# Patient Record
Sex: Female | Born: 1955 | Race: White | Hispanic: No | Marital: Married | Smoking: Never smoker
Health system: Southern US, Community
[De-identification: ages and names within clinical notes are randomized; demographics above are authoritative.]

## PROBLEM LIST (undated history)

## (undated) DIAGNOSIS — E039 Hypothyroidism, unspecified: Secondary | ICD-10-CM

## (undated) DIAGNOSIS — I10 Essential (primary) hypertension: Secondary | ICD-10-CM

## (undated) HISTORY — PX: BACK SURGERY: SHX140

## (undated) HISTORY — PX: BUNIONECTOMY: SHX129

## (undated) HISTORY — PX: ABDOMINAL HYSTERECTOMY: SHX81

---

## 1998-02-11 ENCOUNTER — Other Ambulatory Visit: Admission: RE | Admit: 1998-02-11 | Discharge: 1998-02-11 | Payer: Self-pay | Admitting: Family Medicine

## 1999-05-11 ENCOUNTER — Encounter: Payer: Self-pay | Admitting: Gastroenterology

## 1999-05-11 ENCOUNTER — Ambulatory Visit (HOSPITAL_COMMUNITY): Admission: RE | Admit: 1999-05-11 | Discharge: 1999-05-11 | Payer: Self-pay | Admitting: Gastroenterology

## 2000-05-05 ENCOUNTER — Other Ambulatory Visit: Admission: RE | Admit: 2000-05-05 | Discharge: 2000-05-05 | Payer: Self-pay | Admitting: Family Medicine

## 2001-06-19 ENCOUNTER — Other Ambulatory Visit: Admission: RE | Admit: 2001-06-19 | Discharge: 2001-06-19 | Payer: Self-pay | Admitting: Family Medicine

## 2002-06-20 ENCOUNTER — Other Ambulatory Visit: Admission: RE | Admit: 2002-06-20 | Discharge: 2002-06-20 | Payer: Self-pay | Admitting: Family Medicine

## 2003-09-02 ENCOUNTER — Other Ambulatory Visit: Admission: RE | Admit: 2003-09-02 | Discharge: 2003-09-02 | Payer: Self-pay | Admitting: Family Medicine

## 2004-09-10 ENCOUNTER — Ambulatory Visit (HOSPITAL_COMMUNITY): Admission: RE | Admit: 2004-09-10 | Discharge: 2004-09-10 | Payer: Self-pay | Admitting: Orthopedic Surgery

## 2004-09-10 ENCOUNTER — Ambulatory Visit (HOSPITAL_BASED_OUTPATIENT_CLINIC_OR_DEPARTMENT_OTHER): Admission: RE | Admit: 2004-09-10 | Discharge: 2004-09-10 | Payer: Self-pay | Admitting: Orthopedic Surgery

## 2004-11-25 ENCOUNTER — Ambulatory Visit: Payer: Self-pay | Admitting: Gastroenterology

## 2008-11-07 ENCOUNTER — Other Ambulatory Visit: Admission: RE | Admit: 2008-11-07 | Discharge: 2008-11-07 | Payer: Self-pay | Admitting: Family Medicine

## 2008-11-08 ENCOUNTER — Encounter: Admission: RE | Admit: 2008-11-08 | Discharge: 2008-11-08 | Payer: Self-pay | Admitting: Family Medicine

## 2008-12-17 ENCOUNTER — Ambulatory Visit: Payer: Self-pay | Admitting: Gastroenterology

## 2009-11-10 ENCOUNTER — Encounter: Admission: RE | Admit: 2009-11-10 | Discharge: 2009-11-10 | Payer: Self-pay | Admitting: Orthopedic Surgery

## 2009-11-25 ENCOUNTER — Encounter: Admission: RE | Admit: 2009-11-25 | Discharge: 2009-11-25 | Payer: Self-pay | Admitting: Orthopedic Surgery

## 2010-10-09 NOTE — Op Note (Signed)
NAMEKRISTIEN, [name]                 ACCOUNT NO.:  [id number]   MEDICAL RECORD NO.:  [id number]          PATIENT TYPE:  AMB   LOCATION:  [facility]                         FACILITY:  [facility]   PHYSICIAN:  Marlowe Kays, M.D.  DATE OF BIRTH:  [birth year]   DATE OF PROCEDURE:  09/10/2004  DATE OF DISCHARGE:                                 OPERATIVE REPORT   PREOPERATIVE DIAGNOSIS:  Bilateral carpal tunnel syndrome.   POSTOPERATIVE DIAGNOSIS:  Bilateral carpal tunnel syndrome.   OPERATION:  Decompression median nerve right wrist and hand.   SURGEON:  Marlowe Kays, M.D.   ASSISTANT:  Nurse   ANESTHESIA:  IV regional.   PATHOLOGY AND JUSTIFICATION FOR PROCEDURE:  Signs and symptoms of bilateral  carpal tunnel syndrome with electrical conduction studies indicating  actually the left a little more severe than the right but symptomatically,  the right was worse than the left and consequently, she elected to have the  right one done today.   DESCRIPTION OF PROCEDURE:  An attempt was made to do IV regional anesthesia,  but the IV anesthetic clotted off and consequently, we used local with mild  sedation which worked well.  DuraPrep from mid forearm to fingertips, was  draped in a sterile field.  I marked out the surgical incision obliquely  over the base of the thenar eminence crossing obliquely over the flexor  crease of the wrist and the distal forearm and infiltrated this area with a  combination of 0.5% Marcaine and 1% lidocaine.  The incision was made  without any difficulty.  The palmaris longus tendon was identified at the  flexor wrist and retracted radially.  Beneath it, the median nerve was  identified and then began releasing the skin, subcutaneous tissue, and  fascia in the distal palm.  There were almost no potential bleeders which  were controlled with bipolar cautery.  The individual branches of the nerve  were dissected out into the distal palm.  The wound was then irrigated  with  sterile saline and the wound closed with the skin and subcutaneous tissue  only with interrupted 4-0 nylon mattress sutures.  Adaptic, dry, sterile  dressing, and volar plaster splint were applied.  The tourniquet was  released.  At the time of this dictation, she is on her way to the recovery  room in satisfactory condition with no known complications.      JA/MEDQ  D:  09/10/2004  T:  09/10/2004  Job:  [id number]

## 2011-11-16 ENCOUNTER — Other Ambulatory Visit: Payer: Self-pay | Admitting: Family Medicine

## 2011-11-16 ENCOUNTER — Other Ambulatory Visit (HOSPITAL_COMMUNITY)
Admission: RE | Admit: 2011-11-16 | Discharge: 2011-11-16 | Disposition: A | Discharge status: Home / Self Care | Payer: BC Managed Care – PPO | Source: Ambulatory Visit | Attending: Family Medicine | Admitting: Family Medicine

## 2011-11-16 DIAGNOSIS — Z0000 Encounter for general adult medical examination without abnormal findings: Secondary | ICD-10-CM | POA: Insufficient documentation

## 2012-03-02 ENCOUNTER — Other Ambulatory Visit: Payer: Self-pay | Admitting: Dermatology

## 2012-03-21 ENCOUNTER — Other Ambulatory Visit: Payer: Self-pay | Admitting: Dermatology

## 2013-01-18 ENCOUNTER — Ambulatory Visit
Admission: RE | Admit: 2013-01-18 | Discharge: 2013-01-18 | Disposition: A | Discharge status: Home / Self Care | Payer: BC Managed Care – PPO | Source: Ambulatory Visit | Attending: Family Medicine | Admitting: Family Medicine

## 2013-01-18 ENCOUNTER — Other Ambulatory Visit: Payer: Self-pay | Admitting: Family Medicine

## 2013-01-18 DIAGNOSIS — R0602 Shortness of breath: Secondary | ICD-10-CM

## 2013-01-18 DIAGNOSIS — R7989 Other specified abnormal findings of blood chemistry: Secondary | ICD-10-CM

## 2013-01-18 IMAGING — CT CT ANGIO CHEST
2 of 6 series · 9 of 30 positions shown · IV contrast ([ID] OMNI [id number])
Comparison: No priors.

CLINICAL DATA: Shortness of breath.  Positive D-dimer.  Evaluate
for pulmonary embolism.

CT ANGIOGRAPHY CHEST
TECHNIQUE: Multidetector CT imaging of the chest using the
standard protocol during bolus administration of intravenous
contrast. Multiplanar reconstructed images including MIPs were
obtained and reviewed to evaluate the vascular anatomy.
Contrast: 100mL OMNIPAQUE IOHEXOL 300 MG/ML  SOLN

[Series 4: pe 1.25 · axial · 0.95mm/px · z∈[-228,-50]mm · 5 of 214 slices shown]
[im 36/214  lung]
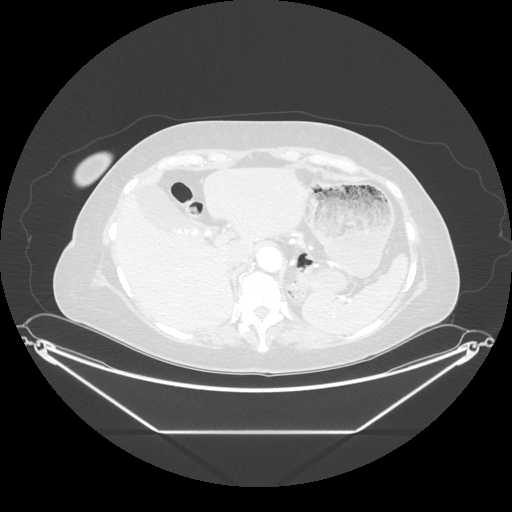
[im 72/214  mediastinal]
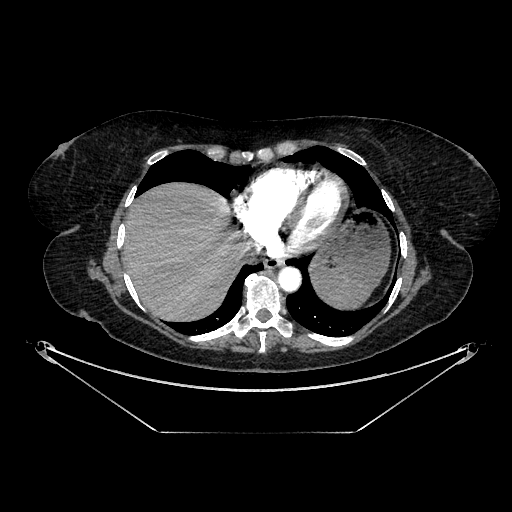
[im 107/214  lung]
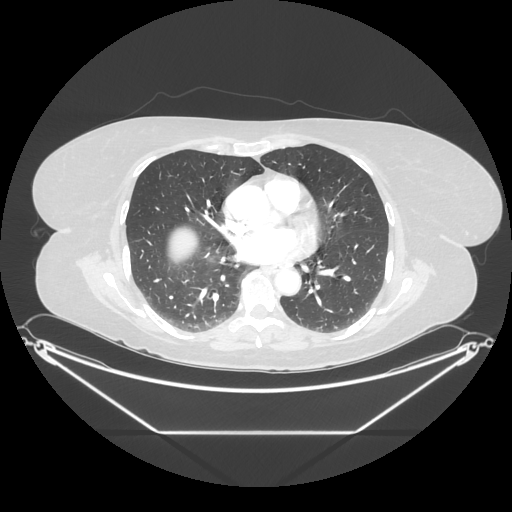
[im 143/214  mediastinal]
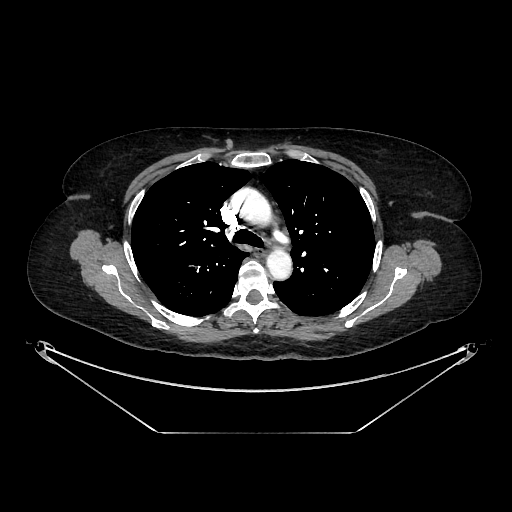
[im 178/214  lung]
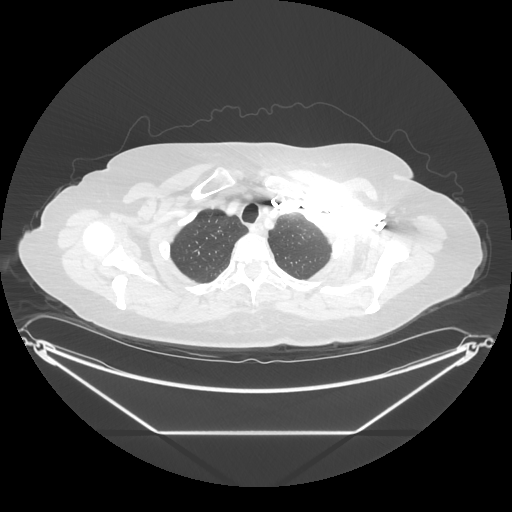

[Series 602: sagittal body · sagittal · 0.95mm/px · 4 of 194 slices shown]
[im 39/194  lung]
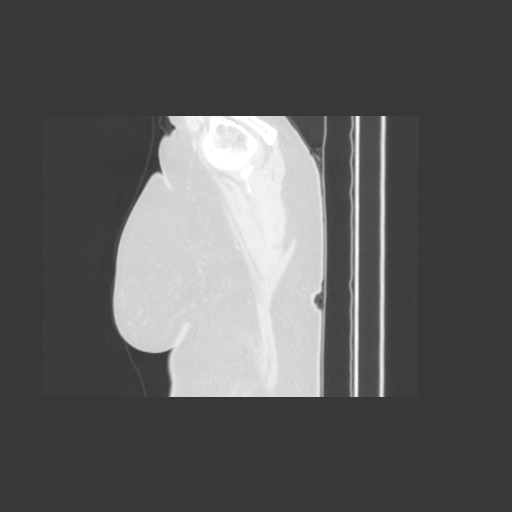
[im 78/194  lung]
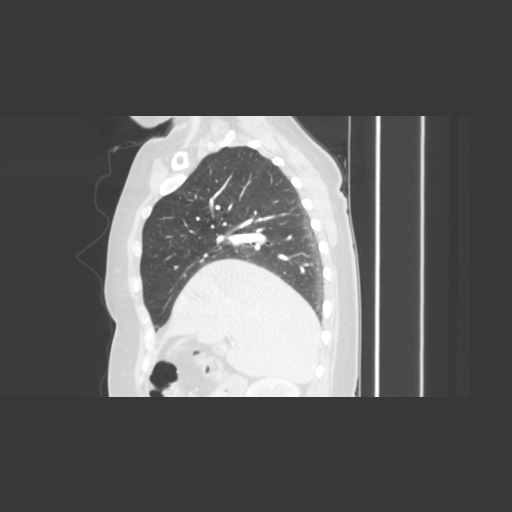
[im 116/194  lung]
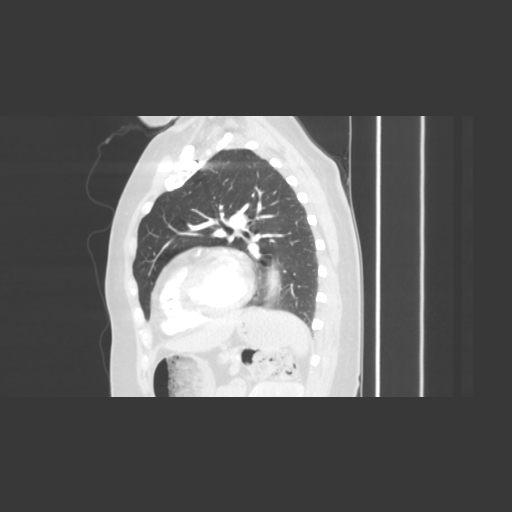
[im 155/194  lung]
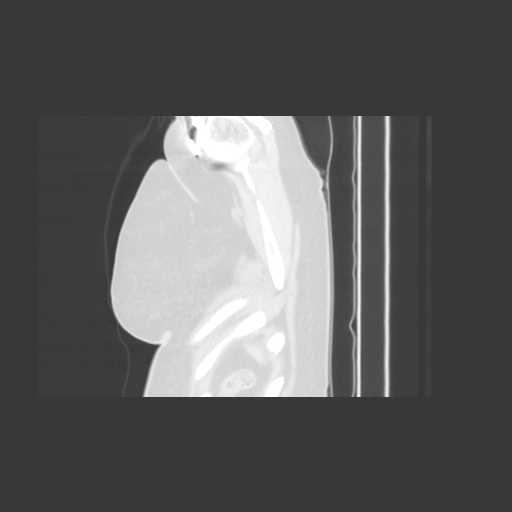

[9 of 30 positions shown; findings below may reference images not displayed]

FINDINGS: Mediastinum: There are no filling defects within the pulmonary
arterial tree to suggest underlying pulmonary embolism. Heart size
is normal. There is no significant pericardial fluid, thickening or
pericardial calcification. No pathologically enlarged mediastinal
or hilar lymph nodes. Esophagus is unremarkable in appearance.

Lungs/Pleura: 4 mm subpleural nodule in the periphery of the left
lower lobe (image 65 of series six) is highly nonspecific.  No
larger more suspicious appearing pulmonary nodules or masses are
otherwise noted.  No acute consolidative airspace disease.  No
pleural effusions.  No pneumothorax.

Upper Abdomen: Multiple small calcified gallstones are noted
layering dependently in the lumen of the gallbladder.  No current
findings to suggest acute cholecystitis at this time.

Musculoskeletal: There are no aggressive appearing lytic or blastic
lesions noted in the visualized portions of the skeleton.
IMPRESSION: 1.  No evidence of pulmonary embolism.
2.  No acute findings in the thorax to account for the patient's
symptoms.
3.  Nonspecific 4 mm subpleural nodule in the posterolateral aspect
of the left lower lobe.  This is favored to represent a subpleural
lymph node, but attention on follow-up studies may be warranted. If
the patient is at high risk for bronchogenic carcinoma, follow-up
chest CT at 1 year is recommended.  If the patient is at low risk,
no follow-up is needed.  This recommendation follows the consensus
statement: Guidelines for Management of Small Pulmonary Nodules
Detected on CT Scans:  A Statement from the [HOSPITAL] as
4.  Cholelithiasis without evidence to suggest acute cholecystitis
at this time.

## 2013-01-18 MED ORDER — IOHEXOL 300 MG/ML  SOLN
100.0000 mL | Freq: Once | INTRAMUSCULAR | Status: AC | PRN
  Administered 2013-01-18: 100 mL via INTRAVENOUS

## 2013-11-08 ENCOUNTER — Other Ambulatory Visit: Payer: Self-pay | Admitting: Obstetrics and Gynecology

## 2013-11-08 ENCOUNTER — Other Ambulatory Visit (HOSPITAL_COMMUNITY)
Admission: RE | Admit: 2013-11-08 | Discharge: 2013-11-08 | Disposition: A | Discharge status: Home / Self Care | Payer: BC Managed Care – PPO | Source: Ambulatory Visit | Attending: Obstetrics and Gynecology | Admitting: Obstetrics and Gynecology

## 2013-11-08 DIAGNOSIS — Z1151 Encounter for screening for human papillomavirus (HPV): Secondary | ICD-10-CM | POA: Insufficient documentation

## 2013-11-08 DIAGNOSIS — Z124 Encounter for screening for malignant neoplasm of cervix: Secondary | ICD-10-CM | POA: Insufficient documentation

## 2013-11-13 LAB — CYTOLOGY - PAP

## 2013-11-19 ENCOUNTER — Other Ambulatory Visit: Payer: Self-pay | Admitting: Urology

## 2014-01-24 ENCOUNTER — Encounter (HOSPITAL_COMMUNITY): Payer: Self-pay | Admitting: Pharmacist

## 2014-01-31 ENCOUNTER — Encounter (HOSPITAL_COMMUNITY): Payer: Self-pay

## 2014-01-31 ENCOUNTER — Encounter (HOSPITAL_COMMUNITY)
Admission: RE | Admit: 2014-01-31 | Discharge: 2014-01-31 | Disposition: A | Discharge status: Home / Self Care | Payer: BC Managed Care – PPO | Source: Ambulatory Visit | Attending: Obstetrics and Gynecology | Admitting: Obstetrics and Gynecology

## 2014-01-31 DIAGNOSIS — Z01812 Encounter for preprocedural laboratory examination: Secondary | ICD-10-CM | POA: Insufficient documentation

## 2014-01-31 DIAGNOSIS — N813 Complete uterovaginal prolapse: Secondary | ICD-10-CM | POA: Insufficient documentation

## 2014-01-31 DIAGNOSIS — Z01810 Encounter for preprocedural cardiovascular examination: Secondary | ICD-10-CM | POA: Insufficient documentation

## 2014-01-31 HISTORY — DX: Hypothyroidism, unspecified: E03.9

## 2014-01-31 HISTORY — DX: Essential (primary) hypertension: I10

## 2014-01-31 LAB — TYPE AND SCREEN
ABO/RH(D): O POS
Antibody Screen: NEGATIVE

## 2014-01-31 LAB — CBC
HCT: 37.3 % (ref 36.0–46.0)
Hemoglobin: 12.4 g/dL (ref 12.0–15.0)
MCH: 29.7 pg (ref 26.0–34.0)
MCHC: 33.2 g/dL (ref 30.0–36.0)
MCV: 89.4 fL (ref 78.0–100.0)
Platelets: 213 10*3/uL (ref 150–400)
RBC: 4.17 MIL/uL (ref 3.87–5.11)
RDW: 12.8 % (ref 11.5–15.5)
WBC: 4.1 10*3/uL (ref 4.0–10.5)

## 2014-01-31 LAB — BASIC METABOLIC PANEL
Anion gap: 13 (ref 5–15)
BUN: 11 mg/dL (ref 6–23)
CHLORIDE: 99 meq/L (ref 96–112)
CO2: 27 meq/L (ref 19–32)
Calcium: 9.9 mg/dL (ref 8.4–10.5)
Creatinine, Ser: 0.93 mg/dL (ref 0.50–1.10)
GFR calc Af Amer: 76 mL/min — ABNORMAL LOW (ref >90)
GFR calc non Af Amer: 66 mL/min — ABNORMAL LOW (ref >90)
GLUCOSE: 93 mg/dL (ref 70–99)
POTASSIUM: 4 meq/L (ref 3.7–5.3)
SODIUM: 139 meq/L (ref 137–147)

## 2014-01-31 LAB — PROTIME-INR
INR: 1 (ref 0.00–1.49)
Prothrombin Time: 13.2 seconds (ref 11.6–15.2)

## 2014-01-31 LAB — ABO/RH: ABO/RH(D): O POS

## 2014-01-31 LAB — APTT: aPTT: 32 seconds (ref 24–37)

## 2014-01-31 NOTE — Patient Instructions (Addendum)
   Your procedure is scheduled on:  Tuesday, Sept 15  Enter through the [employer] [facility] at:  6 AM Pick up the phone at the desk and dial [phone] and inform us of your arrival.  Please call this number if you have any problems the morning of surgery: [phone]  Remember: Do not eat or d rink after midnight: Monday Take these medicines the morning of surgery with a SIP OF WATER: take nighttime blood pressure as normal.. Take am blood pressure as normal...take synthroid am   Do not wear jewelry, make-up, or FINGER nail polish No metal in your hair or on your body. Do not wear lotions, powders, perfumes.  You may wear deodorant.  Do not bring valuables to the hospital. Contacts, dentures or bridgework may not be worn into surgery.  Leave suitcase in the car. After Surgery it may be brought to your room. For patients being admitted to the hospital, checkout time is 11:00am the day of discharge.

## 2014-02-04 MED ORDER — METRONIDAZOLE IN NACL 5-0.79 MG/ML-% IV SOLN
500.0000 mg | INTRAVENOUS | Status: AC
  Administered 2014-02-05: 500 mg via INTRAVENOUS
  Filled 2014-02-04: qty 100

## 2014-02-04 MED ORDER — CIPROFLOXACIN IN D5W 400 MG/200ML IV SOLN
400.0000 mg | INTRAVENOUS | Status: AC
  Administered 2014-02-05: 400 mg via INTRAVENOUS
  Filled 2014-02-04: qty 200

## 2014-02-04 MED ORDER — GENTAMICIN SULFATE 40 MG/ML IJ SOLN
260.0000 mg | INTRAVENOUS | Status: AC
  Administered 2014-02-05: 260 mg via INTRAVENOUS
  Filled 2014-02-04: qty 6.5

## 2014-02-04 MED ORDER — PHENAZOPYRIDINE HCL 200 MG PO TABS
200.0000 mg | ORAL_TABLET | Freq: Once | ORAL | Status: AC
  Administered 2014-02-05: 200 mg via ORAL
  Filled 2014-02-04: qty 1

## 2014-02-04 NOTE — H&P (Signed)
History of Present Illness   Christina [name] has pelvic organ prolapse but does not have a vaginal bulging sensation. Dr Simona Huh thought she may benefit from a hysterectomy and A&P repair and possible vault suspension. She failed a donut pessary. When she has a urinary tract infection she can have incontinence but otherwise does not leak. She sometimes has a little bit of urgency but does not wear pads. She gets up 2-3 times at night. On pelvic examination, her cervical os and bladder were extending well outside the introitus. She lost nearly all of her anterior vaginal length at rest and all but 3 cm posteriorly. Tissues were rather dry. She had grade 2 hypermobility of the bladder neck with reproducible modest positive cough test. She was scanned for 106 mL. CT urogram was ordered for silent hydronephrosis and urodynamics were ordered. I thought if she ever had surgery, she would benefit from a transvaginal hysterectomy, vault suspension, and cystocele repair and graft. She may not need a rectocele repair since with the prolapse reduced she had minimal rectocele. Plus she probably would need a sling.   She had a CT urogram with a pessary in place. She had bilateral hydroureter. I had a question from the radiologist whether or not the large pessary could have been causing some relative obstruction of the ureters. Removal of pessary and reevaluation was a possibility.  Review of Systems: No change in bowel or neurologic systems.   She did not void and was catheterized for 350 mL. Maximum capacity was 800 mL. Bladder was stable. Bladder was somewhat hyposensitive. At 500 mL, her Valsalva leak-point pressure was 61 cmH2O with mild leakage. At 400 mL when she coughed, she leaked a mild to moderate amount at 65 cmH2O. She leaked at 60 cmH2O with moderate leakage at 700 mL. During voluntary voiding she voided 700 mL with a maximum flow of 32 mL/sec. She did strain. Residual was 75 to 100 mL. It appears that she was  generating a detrusor contraction of 10-15 cmH2O but I agree it was somewhat not well sustained and there was straining artifact. Increase in EMG activity was likely from straining. Bladder neck descended 2 cm. In my opinion, the pessary was in the area of the trigone. She had spinal hardware as noted. The details of the urodynamics are signed and dictated on the urodynamic sheet.    Past Medical History Problems  1. History of arthritis (V13.4)  Surgical History Problems  1. History of Back Surgery 2. History of Cataract Surgery 3. History of Foot Surgery  Current Meds 1. ALPRAZolam 0.25 MG Oral Tablet;  Therapy: (Recorded:20May2015) to Recorded 2. Aspirin 81 MG Oral Tablet;  Therapy: (Recorded:20May2015) to Recorded 3. Calcium + D TABS;  Therapy: (Recorded:20May2015) to Recorded 4. CeleBREX 200 MG Oral Capsule;  Therapy: (Recorded:20May2015) to Recorded 5. Claritin TABS;  Therapy: (Recorded:20May2015) to Recorded 6. Clindamycin HCl - 300 MG Oral Capsule; TAKE 1 CAPSULE 4 times daily;  Therapy: [id number] to (Evaluate:29May2015)  Requested for: [id number]; Last  Rx:26May2015 Ordered 7. Cyclobenzaprine HCl - 10 MG Oral Tablet;  Therapy: (Recorded:20May2015) to Recorded 8. Diovan HCT 320-25 MG Oral Tablet;  Therapy: (Recorded:20May2015) to Recorded 9. Estrace 0.1 MG/GM Vaginal Cream;  Therapy: (Recorded:20May2015) to Recorded 10. Fluticasone Propionate 50 MCG/ACT Nasal Suspension;   Therapy: (Recorded:20May2015) to Recorded 11. Levothyroxine Sodium 50 MCG Oral Tablet;   Therapy: (Recorded:20May2015) to Recorded 12. Magnesium TABS;   Therapy: (Recorded:20May2015) to Recorded 13. Milk of Magnesia 400 MG/5ML Oral Suspension;   Therapy: (  Recorded:20May2015) to Recorded 14. Niaspan TBCR;   Therapy: (Recorded:20May2015) to Recorded 15. Omeprazole 40 MG Oral Capsule Delayed Release;   Therapy: (Recorded:20May2015) to Recorded 16. OxyCODONE HCl TABS;   Therapy: (Recorded:20May2015)  to Recorded 17. Premarin 0.625 MG Oral Tablet;   Therapy: (Recorded:20May2015) to Recorded 18. Senokot TABS;   Therapy: (Recorded:20May2015) to Recorded 19. Stool Softener CAPS;   Therapy: (Recorded:20May2015) to Recorded 20. Vitamin D TABS;   Therapy: (Recorded:20May2015) to Recorded 21. Zetia 10 MG Oral Tablet;   Therapy: (Recorded:20May2015) to Recorded  Allergies Medication  1. Keflex TABS  Family History Problems  1. Family history of Death of family member : Father   father passed @ age 6 2. Family history of Alzheimer's disease (V17.2) : Father 3. Family history of kidney stones (V18.69) : Father 4. Family history of Parkinson's disease (V17.2) : Mother  Social History Problems  1. Denied: History of Alcohol use 2. Caffeine use (V49.89)   3 drinks daily 3. Married 4. Non-smoker (V49.89) 5. Number of children   2 daughters 6. Occupation   educator  Assessment Assessed  1. Hydronephrosis (591) 2. Incomplete bladder emptying (788.21) 3. 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 Cystocele, midline (618.01)  Plan Hydronephrosis, Incomplete bladder emptying  1. BUN & CREATININE; Status:Hold For - Specimen/Data Collection,Appointment;  Requested [id number];   Discussion/Summary   I drew Christina Briones a picture. I discussed watchful waiting versus pessary versus a transvaginal hysterectomy and cystocele repair and vault suspension and possible rectocele repair.   I drew her a picture and we talked about prolapse surgery in detail. Pros, cons, general surgical and anesthetic risks, and other options including behavioral therapy, pessaries, and watchful waiting were discussed. She understands that prolapse repairs are successful in 80-85% of cases for prolapse symptoms and can recur anteriorly, posteriorly, and/or apically. She understands that in most cases I use a graft and general risks were discussed. Surgical risks were described but not limited to the discussion of  injury to neighboring structures including the bowel (with possible life-threatening sepsis and colostomy), bladder, urethra, vagina (all resulting in further surgery), and ureter (resulting in re-implantation). We talked about injury to nerves/soft tissue leading to debilitating and intractable pelvic, abdominal, and lower extremity pain syndromes and neuropathies. The risks of buttock pain, intractable dyspareunia, and vaginal narrowing and shortening with sequelae were discussed. Bleeding risks, transfusion rates, and infection were discussed. The risk of persistent, de novo, or worsening bladder and/or bowel incontinence/dysfunction was discussed. The need for CIC was described as well the usual post-operative course. The patient understands that she might not reach her treatment goal and that she might be worse following surgery.  Mesh issues were discussed.  I talked to her about watchful waiting versus physical therapy versus sling for her stress incontinence.  We talked about a sling in detail. Pros, cons, general surgical and anesthetic risks, and other options including behavioral therapy and watchful waiting were discussed. She understands that slings are generally successful in 90% of cases for stress incontinence, 50% for urge incontinence, and that in a small percentage of cases the incontinence can worsen. The risk of persistent, de novo, or worsening incontinence/dysfunction was discussed. Risks were described but not limited to the discussion of injury to neighboring structures including the bowel (with possible life-threatening sepsis and colostomy), bladder, urethra, vagina (all resulting in further surgery), and ureter (resulting in re-implantation). We also talked about the risk of retention requiring urethrolysis, extrusion requiring revision, and erosion resulting in further surgery. Bleeding risks and transfusion  rates and the risk of infection were discussed. The risk of pelvic and  abdominal pain syndromes, dyspareunia, and neuropathies were discussed. The need for CIC was described as well as the usual postoperative course. The patient understands that she might not reach her treatment goal and that she might be worse following surgery. Mesh TV issues were discussed.  Higher risk of retention, especially with her large capacity, hyposensitive, poorly contractile bladder, was discussed. Mesh issues were discussed.  Hydronephrosis was discussed. I think a new baseline without the pessary would be prudent. A BUN and creatinine was recommended as well.   Christina Sabbagh has decided to have a sling and a prolapse repair. I also drew her a picture and talked about the hydronephrosis. She has only had the pessary in for about a week before this x-ray and I truly do not think the pessary would have caused bilateral hydronephrosis asymptomatic for back pain. I am not going to remove the pessary.   I will await to hear from Dr Simona Huh and we will proceed with surgery. I will leave it up to Dr Simona Huh whether or not she wants to remove the pessary 1-2 weeks prior to surgery and recheck her vaginal epithelium for any ulcerations, etc, or vaginitis.   After a thorough review of the management options for the patient's condition the patient  elected to proceed with surgical therapy as noted above. We have discussed the potential benefits and risks of the procedure, side effects of the proposed treatment, the likelihood of the patient achieving the goals of the procedure, and any potential problems that might occur during the procedure or recuperation. Informed consent has been obtained.

## 2014-02-04 NOTE — H&P (Signed)
History of Present Illness  General:  58 y/o presents for a preop exam. TVH/BSO scheduled to be done by me and anterior repair with vaginal vault suspension will be done by Dr. Bernerd Limbo. Pt has preop with him next week. Pt is currently wearing a doughnut pessary. Pt fees much better with it in place, no issues. Still desires to proceed with surgery.   Current Medications  Taking   Celebrex 200 MG Capsule 1 capsule twice a day, Dr. Patrice Paradise, Notes: only prn   Oxycodone HCl 10 MG Tablet 1/2 am, 1/2 during school day, 1 at night Dr. Patrice Paradise   Claritin-D 24 Hour 10-240 MG Tablet Extended Release 24 Hour 1 tablet Once a day   Senokot 8.6 MG Tablet as directed daily   Stool Softener 250 MG Capsule 2 capsules daily   Calcium + D Tablet 1 tablet with food Once a day   Aspirin 81 MG Tablet Delayed Release 1 tablet Once a day   Vitamin D 4000 units Tablet 1 tablet daily   Magnesium 300 MG Capsule 1 capsule with a meal Once a day   Premarin 0.625 MG/GM Cream as directed   Diovan HCT 320-25 MG Tablet 1 tablet Once a day   Zetia 10 mg Tablet 1 tablet Once a day   Niaspan 500 MG Tablet Extended Release 1 tablet at bedtime Once a day   Levothyroxine Sodium 50 MCG Tablet 1 tablet every morning on an empty stomach Once a day   Omeprazole 40 MG Capsule Delayed Release 1 capsule Once a day   Fluticasone Propionate 50 Suspension use two sprays in each nostril every day daily   Not-Taking/PRN   Alprazolam 0.25 MG Tablet 1 tablet three times a day as needed for anxiety   Cyclobenzaprine HCl 10 MG Tablet 1 tablet Three times a day   Milk of Magnesia 400 MG/5ML Suspension 15 ml at bedtime as needed for constipation as needed   Medication List reviewed and reconciled with the patient    Past Medical History  HTN  Hypercholesterolemia  Osteoarthritis behind both knee caps--Dr. Lynann Bologna  Bursitis on B hips, uses ice and heat--Dr. Lynann Bologna  Allergies  Diverticulosis  Hypothyroidism  GERD  DJD  Vitamin D def   Impared fasting glucose  Cystocele/uterine prolapse  Lumbar spondylosis, spondylolisthesis and severe spinal stenosis L4-5, Dr. Patrice Paradise  Bunions  dermatology-Dr. Ubaldo Glassing, dysplastic nevi with moderate/severe atypia  Anxiety  subpleural lung nodule on CT scan 01/18/2013 4 mm in size  gallstones on CT 8/14   Surgical History  back surgery   carpel tunnel on right wrist   cataract surgery    Family History  Father: deceased, DM, Alzheimer, kidney disease, diagnosed with DM  Mother: alive, HTN, tia 38, Parkinson, diagnosed with HTN  Paternal Grand Father: deceased  Paternal Grand Mother: deceased, CVA  Maternal Grand Father: deceased  Maternal Grand Mother: deceased, Colon CA in 44s, diagnosed with Colon Ca  Brother 1: alive, HTN, diagnosed with HTN  Brother2: alive  Sister 1: alive, HTN, diagnosed with HTN  Sister 2: alive, CVA, 69?, diagnosed with CVA  Sister 3: alive  2 brother(s) , 3 sister(s) .   denies any GYN family cancer hx.   Social History  General:  Tobacco use  cigarettes: Never smoked Tobacco history last updated 01/23/2014 no Smoking, no.  no Alcohol.  Caffeine: yes, soda.  no Recreational drug use, no.  Exercise: very active, no formal exercise.  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  Marital Status: Married Education administrator, [employer] retirement, works grading end of grade tests.  Children: Talbert [place].  Religion: [place].  Seat belt use: yes.    Gyn History  Sexual activity not currently sexually active.  Periods : postmenopausal.  LMP over 10 years ago as of 2014.  Birth control none.  Last pap smear date 11/08/13, all negative.  Last mammogram date 11/15/13.  Denies H/O Abnormal pap smear no.  Denies H/O STD none.  Menarche 77.    OB History  Number of pregnancies 2.  Pregnancy # 1 live birth 08/1975 , vaginal delivery, 6 lbs. 4 oz.   Pregnancy # 2 live birth 12/1981 , vaginal delivery, 7 lbs 1 oz.    Allergies  keflex: diarrhea: Side Effects   Hospitalization/Major Diagnostic Procedure  childbirth 75, 81  surgeries    Review of Systems  Denies fever/chills, chest pain, SOB, headaches, numbness/tingling. No h/o complication with anesthesia, bleeding disorders or blood clots.   Vital Signs  Wt 159, Wt change 1.4 lb, Ht 61.25, BMI 29.79, Pulse sitting 81, BP sitting 127/73.   Physical Examination  GENERAL:  Patient appears alert and oriented.  General Appearance: well-appearing, well-developed, no acute distress.  Speech: clear.  LUNGS:  Auscultation: no wheezing/rhonchi/rales. CTA bilaterally.  HEART:  Heart sounds: normal. RRR. no murmur.  ABDOMEN:  General: soft nontender, nondistended, no masses.  FEMALE GENITOURINARY:  Pelvic pessary provides great support. Vaginal mucosa appears moist, not cracked. Cervix nl.  EXTREMITIES:  General: No edema or calf tenderness.     Assessments   1. Pre-op exam - V72.84 (Primary)   2. Pelvic prolapse - 618.9   Treatment  1. Pre-op exam  Notes: R/B/A of procedure discussed with pt at length. All questions answered. Consent obtained. Discussed removal of ovaries, possibility of hot flashes but decreases risk of ovarian cancer. Pt desires to proceed.    Follow Up  2 Weeks post op

## 2014-02-05 ENCOUNTER — Ambulatory Visit (HOSPITAL_COMMUNITY): Payer: BC Managed Care – PPO | Admitting: Certified Registered Nurse Anesthetist

## 2014-02-05 ENCOUNTER — Encounter (HOSPITAL_COMMUNITY): Payer: Self-pay | Admitting: Anesthesiology

## 2014-02-05 ENCOUNTER — Encounter (HOSPITAL_COMMUNITY)
Admission: RE | Disposition: A | Discharge status: Home / Self Care | Payer: Self-pay | Source: Ambulatory Visit | Attending: Obstetrics and Gynecology

## 2014-02-05 ENCOUNTER — Observation Stay (HOSPITAL_COMMUNITY)
Admission: RE | Admit: 2014-02-05 | Discharge: 2014-02-06 | Disposition: A | Discharge status: Home / Self Care | Payer: BC Managed Care – PPO | Source: Ambulatory Visit | Attending: Obstetrics and Gynecology | Admitting: Obstetrics and Gynecology

## 2014-02-05 ENCOUNTER — Encounter (HOSPITAL_COMMUNITY): Payer: BC Managed Care – PPO | Admitting: Certified Registered Nurse Anesthetist

## 2014-02-05 DIAGNOSIS — N838 Other noninflammatory disorders of ovary, fallopian tube and broad ligament: Secondary | ICD-10-CM | POA: Diagnosis not present

## 2014-02-05 DIAGNOSIS — E7800 Pure hypercholesterolemia, unspecified: Secondary | ICD-10-CM | POA: Insufficient documentation

## 2014-02-05 DIAGNOSIS — N393 Stress incontinence (female) (male): Secondary | ICD-10-CM | POA: Insufficient documentation

## 2014-02-05 DIAGNOSIS — D251 Intramural leiomyoma of uterus: Secondary | ICD-10-CM | POA: Diagnosis not present

## 2014-02-05 DIAGNOSIS — N813 Complete uterovaginal prolapse: Secondary | ICD-10-CM | POA: Diagnosis present

## 2014-02-05 DIAGNOSIS — I10 Essential (primary) hypertension: Secondary | ICD-10-CM | POA: Diagnosis not present

## 2014-02-05 DIAGNOSIS — N880 Leukoplakia of cervix uteri: Secondary | ICD-10-CM | POA: Diagnosis not present

## 2014-02-05 HISTORY — PX: VAGINAL PROLAPSE REPAIR: SHX830

## 2014-02-05 HISTORY — PX: VAGINAL HYSTERECTOMY: SHX2639

## 2014-02-05 HISTORY — PX: ANTERIOR AND POSTERIOR REPAIR: SHX5121

## 2014-02-05 HISTORY — PX: PUBOVAGINAL SLING: SHX1035

## 2014-02-05 HISTORY — PX: SALPINGOOPHORECTOMY: SHX82

## 2014-02-05 LAB — TYPE AND SCREEN
ABO/RH(D): O POS
ANTIBODY SCREEN: NEGATIVE

## 2014-02-05 SURGERY — HYSTERECTOMY, VAGINAL
Anesthesia: General | Site: Vagina

## 2014-02-05 MED ORDER — ALUM & MAG HYDROXIDE-SIMETH 200-200-20 MG/5ML PO SUSP: 30.0000 mL | ORAL | Status: DC | PRN

## 2014-02-05 MED ORDER — KETOROLAC TROMETHAMINE 30 MG/ML IJ SOLN: 15.0000 mg | Freq: Once | INTRAMUSCULAR | Status: DC | PRN

## 2014-02-05 MED ORDER — MIDAZOLAM HCL 2 MG/2ML IJ SOLN
INTRAMUSCULAR | Status: AC
  Filled 2014-02-05: qty 2

## 2014-02-05 MED ORDER — MIDAZOLAM HCL 2 MG/2ML IJ SOLN
INTRAMUSCULAR | Status: DC | PRN
  Administered 2014-02-05: 0.5 mg via INTRAVENOUS
  Administered 2014-02-05: 1.5 mg via INTRAVENOUS

## 2014-02-05 MED ORDER — ONDANSETRON HCL 4 MG/2ML IJ SOLN
INTRAMUSCULAR | Status: DC | PRN
  Administered 2014-02-05 (×2): 2 mg via INTRAVENOUS

## 2014-02-05 MED ORDER — HYDROMORPHONE HCL PF 1 MG/ML IJ SOLN: 0.2000 mg | INTRAMUSCULAR | Status: DC | PRN

## 2014-02-05 MED ORDER — DEXAMETHASONE SODIUM PHOSPHATE 10 MG/ML IJ SOLN
INTRAMUSCULAR | Status: DC | PRN
  Administered 2014-02-05: 4 mg via INTRAVENOUS

## 2014-02-05 MED ORDER — LIDOCAINE-EPINEPHRINE (PF) 1 %-1:200000 IJ SOLN
INTRAMUSCULAR | Status: DC | PRN
  Administered 2014-02-05: 15 mL
  Administered 2014-02-05: 20 mL
  Administered 2014-02-05: 10 mL

## 2014-02-05 MED ORDER — LACTATED RINGERS IV SOLN
INTRAVENOUS | Status: DC
  Administered 2014-02-05 – 2014-02-06 (×2): via INTRAVENOUS

## 2014-02-05 MED ORDER — LIDOCAINE HCL (CARDIAC) 20 MG/ML IV SOLN
INTRAVENOUS | Status: AC
  Filled 2014-02-05: qty 5

## 2014-02-05 MED ORDER — LEVOTHYROXINE SODIUM 50 MCG PO TABS
50.0000 ug | ORAL_TABLET | Freq: Every day | ORAL | Status: DC
  Filled 2014-02-05: qty 1

## 2014-02-05 MED ORDER — GLYCOPYRROLATE 0.2 MG/ML IJ SOLN
INTRAMUSCULAR | Status: DC | PRN
  Administered 2014-02-05: 0.1 mg via INTRAVENOUS
  Administered 2014-02-05: 0.4 mg via INTRAVENOUS
  Administered 2014-02-05: 0.1 mg via INTRAVENOUS

## 2014-02-05 MED ORDER — ONDANSETRON HCL 4 MG/2ML IJ SOLN: 4.0000 mg | Freq: Four times a day (QID) | INTRAMUSCULAR | Status: DC | PRN

## 2014-02-05 MED ORDER — STERILE WATER FOR IRRIGATION IR SOLN
Status: DC | PRN
  Administered 2014-02-05: 1 via INTRAVESICAL

## 2014-02-05 MED ORDER — ESTRADIOL 0.1 MG/GM VA CREA
TOPICAL_CREAM | VAGINAL | Status: DC | PRN
  Administered 2014-02-05: 1 via VAGINAL

## 2014-02-05 MED ORDER — ROCURONIUM BROMIDE 100 MG/10ML IV SOLN
INTRAVENOUS | Status: AC
  Filled 2014-02-05: qty 1

## 2014-02-05 MED ORDER — BUPIVACAINE-EPINEPHRINE 0.25% -1:200000 IJ SOLN: INTRAMUSCULAR | Status: DC | PRN

## 2014-02-05 MED ORDER — ESTRADIOL 0.1 MG/GM VA CREA
TOPICAL_CREAM | VAGINAL | Status: AC
  Filled 2014-02-05: qty 42.5

## 2014-02-05 MED ORDER — GLYCOPYRROLATE 0.2 MG/ML IJ SOLN
INTRAMUSCULAR | Status: AC
Start: 2014-02-05 — End: 2014-02-05
  Filled 2014-02-05: qty 3

## 2014-02-05 MED ORDER — IRBESARTAN 300 MG PO TABS
300.0000 mg | ORAL_TABLET | Freq: Every day | ORAL | Status: DC
  Filled 2014-02-05: qty 1

## 2014-02-05 MED ORDER — ONDANSETRON HCL 4 MG/2ML IJ SOLN
INTRAMUSCULAR | Status: AC
  Filled 2014-02-05: qty 2

## 2014-02-05 MED ORDER — NEOSTIGMINE METHYLSULFATE 10 MG/10ML IV SOLN
INTRAVENOUS | Status: AC
  Filled 2014-02-05: qty 1

## 2014-02-05 MED ORDER — FENTANYL CITRATE 0.05 MG/ML IJ SOLN
INTRAMUSCULAR | Status: DC | PRN
Start: 2014-02-05 — End: 2014-02-05
  Administered 2014-02-05 (×3): 50 ug via INTRAVENOUS
  Administered 2014-02-05: 100 ug via INTRAVENOUS

## 2014-02-05 MED ORDER — HYDROMORPHONE HCL PF 1 MG/ML IJ SOLN
INTRAMUSCULAR | Status: DC | PRN
  Administered 2014-02-05: 1 mg via INTRAVENOUS

## 2014-02-05 MED ORDER — LACTATED RINGERS IV SOLN
INTRAVENOUS | Status: DC
  Administered 2014-02-05 (×3): via INTRAVENOUS

## 2014-02-05 MED ORDER — IBUPROFEN 600 MG PO TABS
600.0000 mg | ORAL_TABLET | Freq: Four times a day (QID) | ORAL | Status: DC | PRN
  Administered 2014-02-05: 600 mg via ORAL
  Filled 2014-02-05: qty 1

## 2014-02-05 MED ORDER — KETOROLAC TROMETHAMINE 30 MG/ML IJ SOLN
INTRAMUSCULAR | Status: DC | PRN
  Administered 2014-02-05: 30 mg via INTRAVENOUS

## 2014-02-05 MED ORDER — VALSARTAN-HYDROCHLOROTHIAZIDE 320-25 MG PO TABS: 1.0000 | ORAL_TABLET | Freq: Every day | ORAL | Status: DC

## 2014-02-05 MED ORDER — PROPOFOL 10 MG/ML IV BOLUS
INTRAVENOUS | Status: DC | PRN
  Administered 2014-02-05: 200 mg via INTRAVENOUS

## 2014-02-05 MED ORDER — LIDOCAINE HCL (CARDIAC) 20 MG/ML IV SOLN
INTRAVENOUS | Status: DC | PRN
  Administered 2014-02-05: 50 mg via INTRAVENOUS

## 2014-02-05 MED ORDER — PHENYLEPHRINE 40 MCG/ML (10ML) SYRINGE FOR IV PUSH (FOR BLOOD PRESSURE SUPPORT)
PREFILLED_SYRINGE | INTRAVENOUS | Status: AC
  Filled 2014-02-05: qty 5

## 2014-02-05 MED ORDER — KETOROLAC TROMETHAMINE 30 MG/ML IJ SOLN
INTRAMUSCULAR | Status: AC
  Filled 2014-02-05: qty 1

## 2014-02-05 MED ORDER — SCOPOLAMINE 1 MG/3DAYS TD PT72
1.0000 | MEDICATED_PATCH | Freq: Once | TRANSDERMAL | Status: AC
  Administered 2014-02-05: 1.5 mg via TRANSDERMAL
  Administered 2014-02-05: 1 via TRANSDERMAL

## 2014-02-05 MED ORDER — SCOPOLAMINE 1 MG/3DAYS TD PT72
MEDICATED_PATCH | TRANSDERMAL | Status: AC
  Filled 2014-02-05: qty 1

## 2014-02-05 MED ORDER — FAMOTIDINE 20 MG PO TABS
ORAL_TABLET | ORAL | Status: AC
  Filled 2014-02-05: qty 1

## 2014-02-05 MED ORDER — FAMOTIDINE 20 MG PO TABS
20.0000 mg | ORAL_TABLET | Freq: Once | ORAL | Status: AC
  Administered 2014-02-05: 20 mg via ORAL

## 2014-02-05 MED ORDER — ROCURONIUM BROMIDE 100 MG/10ML IV SOLN
INTRAVENOUS | Status: DC | PRN
  Administered 2014-02-05: 60 mg via INTRAVENOUS
  Administered 2014-02-05 (×2): 5 mg via INTRAVENOUS
  Administered 2014-02-05: 10 mg via INTRAVENOUS

## 2014-02-05 MED ORDER — PROPOFOL 10 MG/ML IV EMUL
INTRAVENOUS | Status: AC
  Filled 2014-02-05: qty 20

## 2014-02-05 MED ORDER — HYDROMORPHONE HCL PF 1 MG/ML IJ SOLN
INTRAMUSCULAR | Status: AC
  Filled 2014-02-05: qty 1

## 2014-02-05 MED ORDER — HYDROMORPHONE HCL PF 1 MG/ML IJ SOLN: 0.2500 mg | INTRAMUSCULAR | Status: DC | PRN

## 2014-02-05 MED ORDER — PROMETHAZINE HCL 25 MG/ML IJ SOLN
6.2500 mg | INTRAMUSCULAR | Status: DC | PRN
Start: 2014-02-05 — End: 2014-02-05

## 2014-02-05 MED ORDER — HYDROCHLOROTHIAZIDE 25 MG PO TABS
25.0000 mg | ORAL_TABLET | Freq: Every day | ORAL | Status: DC
  Filled 2014-02-05: qty 1

## 2014-02-05 MED ORDER — MEPERIDINE HCL 25 MG/ML IJ SOLN: 6.2500 mg | INTRAMUSCULAR | Status: DC | PRN

## 2014-02-05 MED ORDER — POLYMYXIN B SULFATE 500000 UNITS IJ SOLR
INTRAMUSCULAR | Status: DC | PRN
  Administered 2014-02-05: 10:00:00

## 2014-02-05 MED ORDER — NEOSTIGMINE METHYLSULFATE 10 MG/10ML IV SOLN
INTRAVENOUS | Status: DC | PRN
  Administered 2014-02-05: 3 mg via INTRAVENOUS

## 2014-02-05 MED ORDER — PHENYLEPHRINE HCL 10 MG/ML IJ SOLN
INTRAMUSCULAR | Status: DC | PRN
  Administered 2014-02-05 (×5): .04 mg via INTRAVENOUS

## 2014-02-05 MED ORDER — LIDOCAINE-EPINEPHRINE (PF) 1 %-1:200000 IJ SOLN
INTRAMUSCULAR | Status: AC
  Filled 2014-02-05: qty 20

## 2014-02-05 MED ORDER — OXYCODONE-ACETAMINOPHEN 5-325 MG PO TABS: 1.0000 | ORAL_TABLET | ORAL | Status: DC | PRN

## 2014-02-05 MED ORDER — SIMETHICONE 80 MG PO CHEW: 80.0000 mg | CHEWABLE_TABLET | Freq: Four times a day (QID) | ORAL | Status: DC | PRN

## 2014-02-05 MED ORDER — ONDANSETRON HCL 4 MG PO TABS: 4.0000 mg | ORAL_TABLET | Freq: Four times a day (QID) | ORAL | Status: DC | PRN

## 2014-02-05 MED ORDER — POLYMYXIN B SULFATE 500000 UNITS IJ SOLR
Freq: Once | INTRAMUSCULAR | Status: DC
  Filled 2014-02-05: qty 1

## 2014-02-05 MED ORDER — PANTOPRAZOLE SODIUM 40 MG PO TBEC: 40.0000 mg | DELAYED_RELEASE_TABLET | Freq: Every day | ORAL | Status: DC

## 2014-02-05 MED ORDER — FENTANYL CITRATE 0.05 MG/ML IJ SOLN
INTRAMUSCULAR | Status: AC
  Filled 2014-02-05: qty 5

## 2014-02-05 SURGICAL SUPPLY — 64 items
ALLOGRAFT TUTOPLAST AXIS 6X12 (Tissue) ×3 IMPLANT
BLADE 15 SAFETY STRL DISP (BLADE) ×5 IMPLANT
BLADE SURG 10 STRL SS (BLADE) ×5 IMPLANT
BLADE SURG 15 STRL LF C SS BP (BLADE) ×3 IMPLANT
BLADE SURG 15 STRL SS (BLADE) ×2
CANISTER SUCT 3000ML (MISCELLANEOUS) ×10 IMPLANT
CATH FOLEY 2WAY SLVR  5CC 16FR (CATHETERS) ×2
CATH FOLEY 2WAY SLVR 5CC 16FR (CATHETERS) ×3 IMPLANT
CATH ROBINSON RED A/P 16FR (CATHETERS) ×5 IMPLANT
CLOTH BEACON ORANGE TIMEOUT ST (SAFETY) ×5 IMPLANT
CONT PATH 16OZ SNAP LID 3702 (MISCELLANEOUS) ×5 IMPLANT
COVER MAYO STAND STRL (DRAPES) ×5 IMPLANT
DECANTER SPIKE VIAL GLASS SM (MISCELLANEOUS) ×5 IMPLANT
DERMABOND ADVANCED (GAUZE/BANDAGES/DRESSINGS) ×2
DERMABOND ADVANCED .7 DNX12 (GAUZE/BANDAGES/DRESSINGS) ×3 IMPLANT
DEVICE CAPIO SLIM SINGLE (INSTRUMENTS) ×5 IMPLANT
DRAIN PENROSE 1/4X12 LTX (DRAIN) ×5 IMPLANT
DRAPE STERI URO 9X17 APER PCH (DRAPES) ×5 IMPLANT
DRAPE UNDERBUTTOCKS STRL (DRAPE) ×5 IMPLANT
DRSG TELFA 3X8 NADH (GAUZE/BANDAGES/DRESSINGS) ×5 IMPLANT
GAUZE PACKING 2X5 YD STRL (GAUZE/BANDAGES/DRESSINGS) ×5 IMPLANT
GAUZE SPONGE 4X4 16PLY XRAY LF (GAUZE/BANDAGES/DRESSINGS) ×10 IMPLANT
GLOVE BIO SURGEON STRL SZ7 (GLOVE) ×5 IMPLANT
GLOVE BIO SURGEON STRL SZ7.5 (GLOVE) ×5 IMPLANT
GLOVE BIO SURGEON STRL SZ8 (GLOVE) ×10 IMPLANT
GLOVE BIOGEL PI IND STRL 7.0 (GLOVE) ×3 IMPLANT
GLOVE BIOGEL PI IND STRL 8 (GLOVE) ×15 IMPLANT
GLOVE BIOGEL PI INDICATOR 7.0 (GLOVE) ×2
GLOVE BIOGEL PI INDICATOR 8 (GLOVE) ×10
GOWN STRL REUS W/TWL LRG LVL3 (GOWN DISPOSABLE) ×60 IMPLANT
H R LUBE JELLY XXX (MISCELLANEOUS) ×25 IMPLANT
NEEDLE HYPO 22GX1.5 SAFETY (NEEDLE) ×5 IMPLANT
NEEDLE MAYO 6 CRC TAPER PT (NEEDLE) ×5 IMPLANT
NS IRRIG 1000ML POUR BTL (IV SOLUTION) ×10 IMPLANT
PACK VAGINAL WOMENS (CUSTOM PROCEDURE TRAY) ×5 IMPLANT
PAD OB MATERNITY 4.3X12.25 (PERSONAL CARE ITEMS) ×5 IMPLANT
PENCIL BUTTON HOLSTER BLD 10FT (ELECTRODE) ×5 IMPLANT
PLUG CATH AND CAP STER (CATHETERS) ×10 IMPLANT
RETRACTOR STAY HOOK 5MM (MISCELLANEOUS) ×5 IMPLANT
SET CYSTO W/LG BORE CLAMP LF (SET/KITS/TRAYS/PACK) ×5 IMPLANT
SHEET LAVH (DRAPES) ×5 IMPLANT
SLING SYSTEM SPARC (Sling) ×5 IMPLANT
SUT CAPIO ETHIBPND (SUTURE) ×10 IMPLANT
SUT VIC AB 0 CT1 18XCR BRD8 (SUTURE) ×9 IMPLANT
SUT VIC AB 0 CT1 27 (SUTURE) ×2
SUT VIC AB 0 CT1 27XBRD ANBCTR (SUTURE) ×3 IMPLANT
SUT VIC AB 0 CT1 36 (SUTURE) ×10 IMPLANT
SUT VIC AB 0 CT1 8-18 (SUTURE) ×6
SUT VIC AB 0 CT2 27 (SUTURE) IMPLANT
SUT VIC AB 2-0 CT1 (SUTURE) ×10 IMPLANT
SUT VIC AB 2-0 SH 27 (SUTURE) ×6
SUT VIC AB 2-0 SH 27XBRD (SUTURE) ×9 IMPLANT
SUT VIC AB 4-0 PS2 27 (SUTURE) ×5 IMPLANT
SUT VICRYL 0 TIES 12 18 (SUTURE) ×5 IMPLANT
SUT VICRYL 0 UR6 27IN ABS (SUTURE) ×10 IMPLANT
SYR BULB IRRIGATION 50ML (SYRINGE) ×5 IMPLANT
TOWEL OR 17X24 6PK STRL BLUE (TOWEL DISPOSABLE) ×20 IMPLANT
TRAY FOLEY CATH 14FR (SET/KITS/TRAYS/PACK) ×5 IMPLANT
TUBING CONNECTING 10 (TUBING) ×8 IMPLANT
TUBING CONNECTING 10' (TUBING) ×2
TUBING NON-CON 1/4 X 20 CONN (TUBING) ×4 IMPLANT
TUBING NON-CON 1/4 X 20' CONN (TUBING) ×1
TUTOPLAST AXIS 6X12 (Tissue) ×5 IMPLANT
WATER STERILE IRR 1000ML POUR (IV SOLUTION) ×10 IMPLANT

## 2014-02-05 NOTE — Anesthesia Postprocedure Evaluation (Signed)
Anesthesia Post Note  Patient: Christina [name]  Procedure(s) Performed: Procedure(s) (LRB): HYSTERECTOMY VAGINAL (N/A) CYSTOSCOPY/ANTERIOR (CYSTOCELE) AND POSTERIOR REPAIR (RECTOCELE)/VAULT PROLAPSE/SLING AND GRAFT (N/A) VAGINAL VAULT SUSPENSION (N/A) PUBO-VAGINAL SLING (N/A) SALPINGO OOPHORECTOMY (Bilateral)  Anesthesia type: General  Patient location: PACU  Post pain: Pain level controlled  Post assessment: Post-op Vital signs reviewed  Last Vitals:  Filed Vitals:   02/05/14 1230  BP: 91/49  Pulse: 68  Temp:   Resp: 14    Post vital signs: Reviewed  Level of consciousness: sedated  Complications: No apparent anesthesia complications

## 2014-02-05 NOTE — Op Note (Signed)
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  My portion of the procedure was TVHBSO, vaginal vault suspension, A and P anterior repair to be completed by Dr. Matilde Sprang.  SURGEON:  Jola Schmidt, MD.  ASSISTANT:  Dr. Janyth Pupa  ANESTHESIA:  General.  ESTIMATED BLOOD LOSS:  100 mL.  BLOOD ADMINISTERED:  None.  DRAINS:  Foley.  LOCAL:  Lidocaine with epinephrine.  SPECIMEN:  Uterus with bilateral ovaries and fallopian tubes.  SPECIMEN:  Sent to Pathology.  COUNTS:  Correct.  COMPLICATIONS:  None.  DISPOSITION:  To PACU hemodynamically stable.  FINDINGS:  Complete uterine procidentia.  Normal fallopian tubes and ovaries bilaterally.  Normal uterus.  There was a left clear fallopian tube cyst.  PROCEDURE IN DETAIL:  Ms. Jenniges was identified in the holding area. She was then taken to the operating room, where she was placed in the dorsal lithotomy position.  She then underwent general endotracheal anesthesia without complication.  Prior to prepping doughnut pessary was removed without issue.  She was then prepped with Betadine on the vulva and then the abdomen was prepped with DuraPrep up to the xiphoid process.  The urethra was also cleaned as we would place the Foley catheter after she was draped.  Once the patient was draped, we did place a Foley catheter without incident and clear urine was returned.  Weighted speculum was then placed in the vagina.  Deaver was used to identify or to displace the cystocele.  The cervix was then grasped with two single-tooth tenaculum and brought to the introitus.   Lidocaine with epi was injected circumferentially around the cervix.  The Bovie was then used to make a circumferential incision and cut around the cervix. The posterior cul-de-sac was then entered sharply with curved Mayo scissors.  The peritoneum was identified and entered sharply with the Metzenbaum scissors.  The long weighted speculum was then inserted.  The anterior cul-de-sac was then also entered easily with the curved Mayo scissors and the peritoneum was identified easily and entered sharply without any question of injury to the bladder.  The uterosacral ligaments were then tagged with the Heaney clamp.  They were suture ligated and tagged with a 0 Vicryl.  All suture was 0 Vicryl unless otherwise stated.  The same was done on the opposite side.  The uterine arteries were then cut again with a curved Heaney and suture ligated.  Same was done on the opposite side.  Worked the way up the parametrium until we were near the fundus.  I took utero-ovarian ligament and 2 bites initially with a large pedicle on both sides.  So, it was tied with a freehand tie and then a Heaney stitch and held for both.  The uterus was then removed.  The ovaries were very well visualized  once the fundus was approached.  I grabbed the right ovary and tube.  A Heaney clamp was easily placed behind it and cut.  Again, it was tied with a free hand tie 1st and then a Heaney stitch.  No bleeding was noted and that was held.  The same was done on the opposite side.  There was a fallopian tube cyst that was initially visualized upon removing the uterus, but by the time we did the left-hand side that cyst had ruptured.  There was not a large portion of the fallopian tube that was visualized.  We then took the infundibulopelvic ligament with a Heaney, freehand tie, and then suture ligated.  There was no bleeding noted.  The tag was then removed.  The clamp was then removed.  We thought we were done with the  case and there was some bleeding noted up in the upper left abdomen.  Initially, I thought it was the epiploica of the bowel, but we then were able to visualize our infundibulopelvic ligament had slipped and was bleeding.  There was not an excessive amount of bleeding, but the bowel was packed with 4 x 18 and I was able to easily visualize the pedicle and grab it and again freehand tied and suture ligated.  It was not bleeding.  We again thought we were finished with procedure and the pedicle that we were holding slipped through as well and so the same was done with a curved Haney on the infundibulopelvic.  It was just tied and Heaney stitch was placed and then I freehand tied it.  Then, in the area medial to that I used a right angle retractors and tied that off with a Heaney stitch.  I am not sure if the pedicles were dry and the sutures were tearing through.  I am not sure if it was a result of the patient's tissue, but at the end of the case everything was hemostatic.  Dr. Matilde Sprang and his team were awaiting, he will close the vaginal cuff after he does his portion of the procedure.  Abdominal packing was then placed we were doing the bowel a bilateral salpingo-oophorectomy and that was all removed prior to completing my portion of the procedure.     Jola Schmidt, MD     EBV/MEDQ  D:  02/05/2014  T:  02/05/2014  Job:  [id number]

## 2014-02-05 NOTE — Transfer of Care (Signed)
Immediate Anesthesia Transfer of Care Note  Patient: VETTA COUZENS  Procedure(s) Performed: Procedure(s): HYSTERECTOMY VAGINAL (N/A) CYSTOSCOPY/ANTERIOR (CYSTOCELE) AND POSTERIOR REPAIR (RECTOCELE)/VAULT PROLAPSE/SLING AND GRAFT (N/A) VAGINAL VAULT SUSPENSION (N/A) PUBO-VAGINAL SLING (N/A) SALPINGO OOPHORECTOMY (Bilateral)  Patient Location: PACU  Anesthesia Type:General  Level of Consciousness: awake, alert  and oriented  Airway & Oxygen Therapy: Patient Spontanous Breathing and Patient connected to nasal cannula oxygen  Post-op Assessment: Report given to PACU RN, Post -op Vital signs reviewed and stable and Patient moving all extremities X 4  Post vital signs: Reviewed and stable  Complications: No apparent anesthesia complications

## 2014-02-05 NOTE — OR Nursing (Signed)
Suprapubic lap sites cdi x 2 with dermabond-Ameriah Lint rn

## 2014-02-05 NOTE — Op Note (Signed)
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 Nicki Reaper Sergey Ishler Assistant: Leta Baptist Resident assistant: Dr. Amaryllis Dyke  The patient has the above diagnoses and consented to the above procedure. Extra care was taken by gynecology and leg positioning. These were mildly adjusted before I started my case. Vaginal cuff was open. Ureteral sacral ligaments had been tagged. Hemostasis was excellent.  I instilled 20 cc of a lidocaine epinephrine mixture along anterior vaginal wall and between my Allis clamps made a T-shaped incision and did not encroach upon the bladder neck and urethra. I sharply and bluntly mobilized the anterior vaginal wall from the underlying pubocervical fascia to the white line bilaterally. I did sharp mobilization at the level of the apex. Without distorting the anatomy or shortening the bladder I did a 2 layer imbricating repair. She only had a modest central defect.  And then cystoscoped the patient. There was no injury to bladder or urethra. Is no distortion of ureters. There was excellent efflux of peridium bilaterally  I finger dissected to the ischial spine bilaterally. I mobilized all soft tissue medially. I placed 0 Ethibond on a Capio device 1 full finger breath medial to the spine in a straight line between the spines. Rectal examination was normal  With my usual technique I placed a 0 Vicryl suture at the urethrovesical angle. There was no tenting of vaginal mucosa.  A 12 x 6 graft was tapered to a 10 x 6 great and cut in the shape of a trapezoid and sewed in tension-free. I removed an appropriate amount of anterior vaginal wall mucosa and closed the anterior vaginal wall running fashion. I then  closed the apex with running 0 Vicryl suture and plicated ureteral sacral ligaments in the closure.  I did a rectal examination and she did not have a large central defect but she she had deficiency of the posterior fourchette and a lot of laxity and I felt would benefit from a rectocele repair  An Allis clamp was placed on the hymenal ring bilaterally. A small triangle of perineal was removed. 20 cc of a lidocaine epinephrine mixture were utilized. A long posterior incision was made sharply and bluntly dissected the overlying vaginal wall mucosa from the underlying rectovaginal fascia. I did a rectal examination and she had diffuse weakness with almost a stellate tear mid vault. I did a 2 layer imbricating 2-0 Vicryl repair with a second layer. Rectal examination demonstrated strong support though she did have a lot of thinning of her tissues posteriorly. There was not a lot of healthy thick rectovaginal fascia but I did not think she should have a graft. A few millimeters of posterior vaginal wall mucosa was removed and I closed the posterior vaginal wall with running 2-0 Vicryl on a CT1 needle. 1 gentle 0 Vicryl perineal body closure was utilized. Perineum was closed with running 2-0 Vicryl.  She had excellent length. She very good vaginal width. Hemostasis was excellent.  I gently placed my speculum and performed a sling.  Two less than 1 cm incisions were made 1 fingerbreadth above the symphysis pubis 1.5 cm lateral to the midline. A 2 cm appropriate depth suburethral incision was made underneath the mid urethra after instilling approximately 5 cc of 1% lidocaine mixture. I sharply and bluntly dissected to the urethral  vesical angle bilaterally.  With the bladder empty I passed a SPARC needle on top of and along the back of the symphysis pubis staying  on the periosteum and staying lateral using my box technique and delivering the needle onto the pulp of my  index finger bilaterally.  I  cystoscoped the patient thoroughly and there was no injury to the bladder or urethra. There was no movement  or indentation of the bladder with movement of the trocar. There was excellent efflux of blue urine bilaterally.  With the bladder emptied I attached the [facility] [employer] sling and brought it up through the retropubic space bilaterally. I tensioned it over the fat part of a moderate size Kelly clamp. I cut below the blue dots, irrigated the sheaths, and removed the sheaths. I was very happy with the position and tension of the sling With appropriate hypermobility and no springback effect.  All incisions were irrigated. The sling was cut below the skin level. I closed the anterior vaginal wall with  running 2-0 Vicryl followed by my interrupted sutures. Interrupted 4-0 Vicryl was used for the abdominal incisions.  The patient had a very short urethra but I was very pleased with the position of the sling and it was not underneath the proximal urethra  Blood loss was less than 100 mL. I was very pleased with the surgery.  The patient was taken to the recovery room and hopefully this procedure will reach her treatment goal.

## 2014-02-05 NOTE — Brief Op Note (Signed)
02/05/2014  9:35 AM  PATIENT:  Christina [name]  58 y.o. female  PRE-OPERATIVE DIAGNOSIS:  Pelvic Prolapse  POST-OPERATIVE DIAGNOSIS:  Pelvic Prolapse  PROCEDURE:  Procedure(s): HYSTERECTOMY VAGINAL (N/A) CYSTOSCOPY/ANTERIOR (CYSTOCELE) AND POSTERIOR REPAIR (RECTOCELE)/VAULT PROLAPSE/SLING AND GRAFT (N/A) VAGINAL VAULT SUSPENSION (N/A) PUBO-VAGINAL SLING (N/A) BSO  SURGEON:  Surgeon(s) and Role: Panel 1:    * Thurnell Lose, MD - Primary    * Annalee Genta, DO - Assisting  Panel 2:    * Reece Packer, MD - Primary  PHYSICIAN ASSISTANT:   ASSISTANTS: Technician   ANESTHESIA:   general  EBL:  Total I/O In: 1000 [I.V.:1000] Out: 100 [Blood:100]  BLOOD ADMINISTERED:none  DRAINS: Urinary Catheter (Foley)   LOCAL MEDICATIONS USED:  LIDOCAINE  and OTHER w epinephrine  SPECIMEN:  Source of Specimen:  uterus, bilateral ovaries and fallopian tubes  DISPOSITION OF SPECIMEN:  PATHOLOGY  COUNTS:  YES  TOURNIQUET:  * No tourniquets in log *  DICTATION: .Other Dictation: Dictation Number [id number]  PLAN OF CARE: Admit for overnight observation  PATIENT DISPOSITION:  PACU - hemodynamically stable.   Delay start of Pharmacological VTE agent (>24hrs) due to surgical blood loss or risk of bleeding: yes

## 2014-02-05 NOTE — Interval H&P Note (Signed)
History and Physical Interval Note:  02/05/2014 7:40 AM  Aldona Lento  has presented today for surgery, with the diagnosis of Pelvic Prolapse  The various methods of treatment have been discussed with the patient and family. After consideration of risks, benefits and other options for treatment, the patient has consented to  Procedure(s): HYSTERECTOMY VAGINAL (N/A) CYSTOSCOPY/ANTERIOR (CYSTOCELE) AND POSTERIOR REPAIR (RECTOCELE)/VAULT PROLAPSE/SLING AND GRAFT (N/A) VAGINAL VAULT SUSPENSION (N/A) PUBO-VAGINAL SLING (N/A) as a surgical intervention .  The patient's history has been reviewed, patient examined, no change in status, stable for surgery.  I have reviewed the patient's chart and labs.  Questions were answered to the patient's satisfaction.     Simona Huh, Charlea Nardo

## 2014-02-05 NOTE — Anesthesia Preprocedure Evaluation (Signed)
Anesthesia Evaluation  Patient identified by MRN, date of birth, ID band Patient awake    Reviewed: Allergy & Precautions, H&P , NPO status , Patient's Chart, lab work & pertinent test results, reviewed documented beta blocker date and time   Airway Mallampati: II TM Distance: >3 FB Neck ROM: full    Dental no notable dental hx. (+) Teeth Intact   Pulmonary neg pulmonary ROS,          Cardiovascular hypertension, Pt. on medications     Neuro/Psych negative neurological ROS  negative psych ROS   GI/Hepatic negative GI ROS, Neg liver ROS,   Endo/Other  Hypothyroidism   Renal/GU negative Renal ROS     Musculoskeletal   Abdominal Normal abdominal exam  (+)   Peds  Hematology negative hematology ROS (+)   Anesthesia Other Findings   Reproductive/Obstetrics negative OB ROS                           Anesthesia Physical Anesthesia Plan  ASA: II  Anesthesia Plan: General   Post-op Pain Management:    Induction: Intravenous  Airway Management Planned: Oral ETT  Additional Equipment:   Intra-op Plan:   Post-operative Plan: Extubation in OR  Informed Consent: I have reviewed the patients History and Physical, chart, labs and discussed the procedure including the risks, benefits and alternatives for the proposed anesthesia with the patient or authorized representative who has indicated his/her understanding and acceptance.   Dental Advisory Given  Plan Discussed with: CRNA and Surgeon  Anesthesia Plan Comments:         Anesthesia Quick Evaluation

## 2014-02-05 NOTE — Progress Notes (Signed)
Minimal pain No leg / nerve pain Urine clear See in am i will look after foley orders

## 2014-02-06 ENCOUNTER — Encounter (HOSPITAL_COMMUNITY): Payer: Self-pay | Admitting: Obstetrics and Gynecology

## 2014-02-06 DIAGNOSIS — N813 Complete uterovaginal prolapse: Secondary | ICD-10-CM | POA: Diagnosis not present

## 2014-02-06 LAB — CBC
HCT: 29.9 % — ABNORMAL LOW (ref 36.0–46.0)
HEMOGLOBIN: 10 g/dL — AB (ref 12.0–15.0)
MCH: 29.6 pg (ref 26.0–34.0)
MCHC: 33.4 g/dL (ref 30.0–36.0)
MCV: 88.5 fL (ref 78.0–100.0)
Platelets: 181 10*3/uL (ref 150–400)
RBC: 3.38 MIL/uL — ABNORMAL LOW (ref 3.87–5.11)
RDW: 12.9 % (ref 11.5–15.5)
WBC: 7 10*3/uL (ref 4.0–10.5)

## 2014-02-06 MED ORDER — CIPROFLOXACIN HCL 250 MG PO TABS: 250.0000 mg | ORAL_TABLET | Freq: Two times a day (BID) | ORAL | Status: AC

## 2014-02-06 MED ORDER — IBUPROFEN 600 MG PO TABS: 600.0000 mg | ORAL_TABLET | Freq: Four times a day (QID) | ORAL | Status: AC | PRN

## 2014-02-06 NOTE — Anesthesia Postprocedure Evaluation (Signed)
  Anesthesia Post-op Note  Patient: Christina [name]  Procedure(s) Performed: Procedure(s): HYSTERECTOMY VAGINAL (N/A) CYSTOSCOPY/ANTERIOR (CYSTOCELE) AND POSTERIOR REPAIR (RECTOCELE)/VAULT PROLAPSE/SLING AND GRAFT (N/A) VAGINAL VAULT SUSPENSION (N/A) PUBO-VAGINAL SLING (N/A) SALPINGO OOPHORECTOMY (Bilateral)  Patient Location: [facility]  Anesthesia Type:General  Level of Consciousness: awake, alert , oriented and patient cooperative  Airway and Oxygen Therapy: Patient Spontanous Breathing  Post-op Pain: mild  Post-op Assessment: Patient's Cardiovascular Status Stable and Respiratory Function Stable;No nausea  Post-op Vital Signs: stable  Last Vitals:  Filed Vitals:   02/06/14 0500  BP: 125/65  Pulse: 64  Temp: 36.6 C  Resp: 16    Complications: No apparent anesthesia complications

## 2014-02-06 NOTE — Progress Notes (Signed)
Discharge instructions reviewed with patient and family.  Both state understanding of home care, attachment of leg bag and how to empty large foley bag, medications, activity, signs/symtpoms to report to Md and return Md office visit.  Patients family will assist with her care @ home and leg bag sent home with instructions.  Patient ambulated for discharge in stable condition with staff without incident.

## 2014-02-06 NOTE — Progress Notes (Signed)
Looks great; minimal pain. Labs OK Send home today if OK with Dr Simona Huh

## 2014-02-06 NOTE — Addendum Note (Signed)
Addendum created 02/06/14 1006 by Georgeanne Nim, CRNA   Modules edited: Notes Section   Notes Section:  File: [id number]

## 2014-02-06 NOTE — Discharge Summary (Signed)
Physician Discharge Summary  Patient ID: Christina [name] MRN: [id number] DOB/AGE: [birth year] 60 y.o.  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  Pt had not voided at the time of this note.  Consults: None  Significant Diagnostic Studies: labs: Hg 10 post op  Treatments: surgery: TVH/BSO, Anterior/posterior repair, vault suspension-Please see Dr. Carmel Sacramento op note for details.  Discharge Exam: Blood pressure 125/65, pulse 64, temperature 97.8 F (36.6 C), temperature source Oral, resp. rate 16, height 5' (1.524 m), weight 71.668 kg (158 lb), SpO2 100.00%. See progress note.  Disposition: Final discharge disposition not confirmed  Discharge Instructions   Call MD for:  difficulty breathing, headache or visual disturbances    Complete by:  As directed      Call MD for:  extreme fatigue    Complete by:  As directed      Call MD for:  persistant dizziness or light-headedness    Complete by:  As directed      Call MD for:  redness, tenderness, or signs of infection (pain, swelling, redness, odor or green/yellow discharge around incision site)    Complete by:  As directed      Call MD for:  severe uncontrolled pain    Complete by:  As directed      Diet - low sodium heart healthy    Complete by:  As directed      Discharge instructions    Complete by:  As directed   See discharge instructions.     Increase activity slowly    Complete by:  As directed      Lifting restrictions    Complete by:  As directed   Per Dr. Bernerd Limbo.     Maintain IV access    Complete by:  As directed      Sexual Activity Restrictions    Complete by:  As directed   Pelvic rest x 6 weeks.            Medication List    STOP taking these medications       aspirin 81 MG chewable tablet     celecoxib 200 MG capsule  Commonly known as:  CELEBREX      TAKE these medications       ALPRAZolam 0.25 MG tablet  Commonly known as:  XANAX  Take 0.25 mg by mouth at bedtime as needed for anxiety.     calcium-vitamin D 500-200 MG-UNIT per tablet  Commonly known as:  OSCAL WITH D  Take 1 tablet by mouth daily with breakfast.     cholecalciferol 1000 UNITS tablet  Commonly known as:  VITAMIN D  Take 4,000 Units by mouth daily.     ciprofloxacin 250 MG tablet  Commonly known as:  CIPRO  Take 1 tablet (250 mg total) by mouth 2 (two) times daily.     conjugated estrogens vaginal cream  Commonly known as:  PREMARIN  Place 1 Applicatorful vaginally 2 (two) times a week.     cyclobenzaprine 10 MG tablet  Commonly known as:  FLEXERIL  Take 10 mg by mouth 3 (three) times daily as needed for muscle spasms.     ezetimibe 10 MG tablet  Commonly known as:  ZETIA  Take 10 mg by mouth daily.     fluticasone 50 MCG/ACT nasal spray  Commonly known  as:  FLONASE  Place 1 spray into both nostrils daily.     ibuprofen 600 MG tablet  Commonly known as:  ADVIL,MOTRIN  Take 1 tablet (600 mg total) by mouth every 6 (six) hours as needed (mild pain).     levothyroxine 50 MCG tablet  Commonly known as:  SYNTHROID, LEVOTHROID  Take 50 mcg by mouth daily before breakfast.     loratadine-pseudoephedrine 10-240 MG per 24 hr tablet  Commonly known as:  CLARITIN-D 24-hour  Take 1 tablet by mouth daily.     Magnesium 250 MG Tabs  Take 500 mg by mouth at bedtime.     niacin 500 MG CR tablet  Commonly known as:  NIASPAN  Take 500 mg by mouth at bedtime.     omeprazole 20 MG capsule  Commonly known as:  PRILOSEC  Take 20 mg by mouth daily.     oxyCODONE-acetaminophen 10-325 MG per tablet  Commonly known as:  PERCOCET  Take 0.5 tablets by mouth every 6 (six) hours as needed for pain.     senna 8.6 MG tablet  Commonly known as:  SENOKOT  Take 1 tablet by mouth daily.      valsartan-hydrochlorothiazide 320-25 MG per tablet  Commonly known as:  DIOVAN-HCT  Take 1 tablet by mouth daily.           Follow-up Information   Follow up with MACDIARMID,SCOTT A, MD. (office will call you with date and time of follow up appt.  )    Specialty:  Urology   Contact information:   [place] [address] [phone]       Follow up with Thurnell Lose, MD In 2 weeks. (Post op check)    Specialty:  Obstetrics and Gynecology   Contact information:   [address] [place]  [id number] [phone]       Signed: Thurnell Lose 02/06/2014, 10:02 AM

## 2014-02-06 NOTE — Discharge Instructions (Signed)
As discussed with Dr. Matilde Sprang Hysterectomy Information  A hysterectomy is a surgery in which your uterus is removed. This surgery may be done to treat various medical problems. After the surgery, you will no longer have menstrual periods. The surgery will also make you unable to become pregnant (sterile). The fallopian tubes and ovaries can be removed (bilateral salpingo-oophorectomy) during this surgery as well.  REASONS FOR A HYSTERECTOMY  Persistent, abnormal bleeding.  Lasting (chronic) pelvic pain or infection.  The lining of the uterus (endometrium) starts growing outside the uterus (endometriosis).  The endometrium starts growing in the muscle of the uterus (adenomyosis).  The uterus falls down into the vagina (pelvic organ prolapse).  Noncancerous growths in the uterus (uterine fibroids) that cause symptoms.  Precancerous cells.  Cervical cancer or uterine cancer. TYPES OF HYSTERECTOMIES  Supracervical hysterectomy--In this type, the top part of the uterus is removed, but not the cervix.  Total hysterectomy--The uterus and cervix are removed.  Radical hysterectomy--The uterus, the cervix, and the fibrous tissue that holds the uterus in place in the pelvis (parametrium) are removed. WAYS A HYSTERECTOMY CAN BE PERFORMED  Abdominal hysterectomy--A large surgical cut (incision) is made in the abdomen. The uterus is removed through this incision.  Vaginal hysterectomy--An incision is made in the vagina. The uterus is removed through this incision. There are no abdominal incisions.  Conventional laparoscopic hysterectomy--Three or four small incisions are made in the abdomen. A thin, lighted tube with a camera (laparoscope) is inserted into one of the incisions. Other tools are put through the other incisions. The uterus is cut into small pieces. The small pieces are removed through the incisions, or they are removed through the vagina.  Laparoscopically assisted vaginal  hysterectomy (LAVH)--Three or four small incisions are made in the abdomen. Part of the surgery is performed laparoscopically and part vaginally. The uterus is removed through the vagina.  Robot-assisted laparoscopic hysterectomy--A laparoscope and other tools are inserted into 3 or 4 small incisions in the abdomen. A computer-controlled device is used to give the surgeon a 3D image and to help control the surgical instruments. This allows for more precise movements of surgical instruments. The uterus is cut into small pieces and removed through the incisions or removed through the vagina. RISKS AND COMPLICATIONS  Possible complications associated with this procedure include:  Bleeding and risk of blood transfusion. Tell your health care provider if you do not want to receive any blood products.  Blood clots in the legs or lung.  Infection.  Injury to surrounding organs.  Problems or side effects related to anesthesia.  Conversion to an abdominal hysterectomy from one of the other techniques. WHAT TO EXPECT AFTER A HYSTERECTOMY  You will be given pain medicine.  You will need to have someone with you for the first 3-5 days after you go home.  You will need to follow up with your surgeon in 2-4 weeks after surgery to evaluate your progress.  You may have early menopause symptoms such as hot flashes, night sweats, and insomnia.  If you had a hysterectomy for a problem that was not cancer or not a condition that could lead to cancer, then you no longer need Pap tests. However, even if you no longer need a Pap test, a regular exam is a good idea to make sure no other problems are starting. Document Released: 11/03/2000 Document Revised: 02/28/2013 Document Reviewed: 01/15/2013 [facility] Patient Information 2015 [place], [place]. This information is not intended to replace advice given to  you by your health care provider. Make sure you discuss any questions you have with your health care  provider.

## 2014-02-06 NOTE — Progress Notes (Signed)
POD #1  Pt without complaints.  Pain minimal overnight.  Tolerating po.  +flatus.  Denies vaginal bleeding.  Urge to urinate but did not void when she attempted. Has chronic back pain for which she takes Percocet 10 mg.  Normally gets rx #120.  "I have plenty at home."  States she only uses 1/2 tablet BID. VSS UOP good. Post op hemoglobin appropriate. Gen: NAD Abdomen soft, nondistended, nontender, incision c/d/i Ext:   No edema or calf tenderness.  A/P S/p TVH/BSO, Multiple procedures by urology for prolapse. Doing well.   Awaiting pt to void prior to discharge.  If unable to void, RN to call Dr. Bernerd Limbo. Reviewed lifting restrictions. Regarding pain management, pt will continue Percocet for breakthrough pain.  Pain is minimally so an addition narcotic prescription was not written.  She gets routine drug test from Ortho doctor.  Will send copy of this note. Discontinue Celebrex, Motrin 600 mg every 6 hour prn pain.  F/u in 2 weeks.

## 2014-02-06 NOTE — Progress Notes (Signed)
Ur chart review completed.  

## 2014-10-24 ENCOUNTER — Encounter (HOSPITAL_COMMUNITY): Payer: Self-pay | Admitting: Emergency Medicine

## 2014-10-24 ENCOUNTER — Emergency Department (HOSPITAL_COMMUNITY): Payer: BC Managed Care – PPO

## 2014-10-24 ENCOUNTER — Emergency Department (HOSPITAL_COMMUNITY)
Admission: EM | Admit: 2014-10-24 | Discharge: 2014-10-24 | Disposition: A | Discharge status: Home / Self Care | Payer: BC Managed Care – PPO | Attending: Emergency Medicine | Admitting: Emergency Medicine

## 2014-10-24 DIAGNOSIS — E039 Hypothyroidism, unspecified: Secondary | ICD-10-CM | POA: Diagnosis not present

## 2014-10-24 DIAGNOSIS — Y9222 Religious institution as the place of occurrence of the external cause: Secondary | ICD-10-CM | POA: Insufficient documentation

## 2014-10-24 DIAGNOSIS — Y998 Other external cause status: Secondary | ICD-10-CM | POA: Insufficient documentation

## 2014-10-24 DIAGNOSIS — S40011A Contusion of right shoulder, initial encounter: Secondary | ICD-10-CM | POA: Insufficient documentation

## 2014-10-24 DIAGNOSIS — Y9389 Activity, other specified: Secondary | ICD-10-CM | POA: Insufficient documentation

## 2014-10-24 DIAGNOSIS — S63281A Dislocation of proximal interphalangeal joint of left index finger, initial encounter: Secondary | ICD-10-CM | POA: Diagnosis not present

## 2014-10-24 DIAGNOSIS — S40021A Contusion of right upper arm, initial encounter: Secondary | ICD-10-CM

## 2014-10-24 DIAGNOSIS — Z792 Long term (current) use of antibiotics: Secondary | ICD-10-CM | POA: Diagnosis not present

## 2014-10-24 DIAGNOSIS — W1839XA Other fall on same level, initial encounter: Secondary | ICD-10-CM | POA: Diagnosis not present

## 2014-10-24 DIAGNOSIS — S6992XA Unspecified injury of left wrist, hand and finger(s), initial encounter: Secondary | ICD-10-CM | POA: Diagnosis present

## 2014-10-24 DIAGNOSIS — Z7951 Long term (current) use of inhaled steroids: Secondary | ICD-10-CM | POA: Diagnosis not present

## 2014-10-24 DIAGNOSIS — I10 Essential (primary) hypertension: Secondary | ICD-10-CM | POA: Insufficient documentation

## 2014-10-24 DIAGNOSIS — Z79899 Other long term (current) drug therapy: Secondary | ICD-10-CM | POA: Diagnosis not present

## 2014-10-24 DIAGNOSIS — IMO0001 Reserved for inherently not codable concepts without codable children: Secondary | ICD-10-CM

## 2014-10-24 DIAGNOSIS — S63259A Unspecified dislocation of unspecified finger, initial encounter: Secondary | ICD-10-CM

## 2014-10-24 MED ORDER — LIDOCAINE-EPINEPHRINE (PF) 2 %-1:200000 IJ SOLN
10.0000 mL | Freq: Once | INTRAMUSCULAR | Status: DC
  Filled 2014-10-24: qty 20

## 2014-10-24 MED ORDER — LIDOCAINE HCL 2 % IJ SOLN
10.0000 mL | Freq: Once | INTRAMUSCULAR | Status: AC
  Administered 2014-10-24: 200 mg
  Filled 2014-10-24: qty 20

## 2014-10-24 NOTE — ED Provider Notes (Signed)
CSN: [id number]     Arrival date & time 10/24/14  1850 History   First MD Initiated Contact with Patient 10/24/14 2027     Chief Complaint  Patient presents with  . Fall     (Consider location/radiation/quality/duration/timing/severity/associated sxs/prior Treatment) HPI Christina [name] is a 59 y.o. female who presents for evaluation of injuries from fall. She hurt her right shoulder and left index finger when she fell after tripping while walking in an unfamiliar area. He denies head, neck or back injuries. She bruised her left knee when she fell. She is able to walk. No nausea, vomiting, chest pain, shortness breath, weakness or dizziness. There are no other known modifying factors.    Past Medical History  Diagnosis Date  . Hypertension   . Hypothyroidism    Past Surgical History  Procedure Laterality Date  . Back surgery      2011  . Vaginal hysterectomy N/A 02/05/2014    Procedure: HYSTERECTOMY VAGINAL;  Surgeon: Thurnell Lose, MD;  Location: [facility];  Service: Gynecology;  Laterality: N/A;  . Salpingoophorectomy Bilateral 02/05/2014    Procedure: SALPINGO OOPHORECTOMY;  Surgeon: Thurnell Lose, MD;  Location: [facility];  Service: Gynecology;  Laterality: Bilateral;  . Anterior and posterior repair N/A 02/05/2014    Procedure: CYSTOSCOPY/ANTERIOR (CYSTOCELE) AND POSTERIOR REPAIR (RECTOCELE)/VAULT PROLAPSE/SLING AND GRAFT;  Surgeon: Reece Packer, MD;  Location: [facility];  Service: Urology;  Laterality: N/A;  . Vaginal prolapse repair N/A 02/05/2014    Procedure: VAGINAL VAULT SUSPENSION;  Surgeon: Reece Packer, MD;  Location: [facility];  Service: Urology;  Laterality: N/A;  . Pubovaginal sling N/A 02/05/2014    Procedure: Gaynelle Arabian;  Surgeon: Reece Packer, MD;  Location: [facility];  Service: Urology;  Laterality: N/A;  . Abdominal hysterectomy    . Bunionectomy     Family History  Problem Relation Age of Onset  . Hypertension Mother   . Parkinson's disease Mother    . Diabetes Father    History  Substance Use Topics  . Smoking status: Never Smoker   . Smokeless tobacco: Never Used  . Alcohol Use: No   OB History    No data available     Review of Systems  All other systems reviewed and are negative.     Allergies  Cephalexin  Home Medications   Prior to Admission medications   Medication Sig Start Date End Date Taking? Authorizing Provider  ALPRAZolam Duanne Moron) 0.25 MG tablet Take 0.25 mg by mouth daily as needed for anxiety (anxiety).    Yes Historical Provider, MD  calcium-vitamin D (OSCAL WITH D) 500-200 MG-UNIT per tablet Take 1 tablet by mouth daily with breakfast.   Yes Historical Provider, MD  cyclobenzaprine (FLEXERIL) 10 MG tablet Take 10 mg by mouth 2 (two) times daily.    Yes Historical Provider, MD  ezetimibe (ZETIA) 10 MG tablet Take 10 mg by mouth at bedtime.    Yes Historical Provider, MD  fluticasone (FLONASE) 50 MCG/ACT nasal spray Place 1 spray into both nostrils daily.   Yes Historical Provider, MD  levothyroxine (SYNTHROID, LEVOTHROID) 50 MCG tablet Take 50 mcg by mouth daily before breakfast.   Yes Historical Provider, MD  loratadine-pseudoephedrine (CLARITIN-D 24-HOUR) 10-240 MG per 24 hr tablet Take 1 tablet by mouth daily.   Yes Historical Provider, MD  Magnesium 250 MG TABS Take 500 mg by mouth at bedtime.   Yes Historical Provider, MD  niacin (NIASPAN) 500 MG CR tablet Take 500 mg by  mouth at bedtime.   Yes Historical Provider, MD  omeprazole (PRILOSEC) 20 MG capsule Take 20 mg by mouth daily as needed (indigestion).    Yes Historical Provider, MD  oxyCODONE-acetaminophen (PERCOCET) 10-325 MG per tablet Take 0.5 tablets by mouth 2 (two) times daily.    Yes Historical Provider, MD  senna (SENOKOT) 8.6 MG tablet Take 1 tablet by mouth at bedtime.    Yes Historical Provider, MD  valsartan-hydrochlorothiazide (DIOVAN-HCT) 320-25 MG per tablet Take 1 tablet by mouth daily.   Yes Historical Provider, MD  Vitamin D,  Cholecalciferol, 400 UNITS TABS Take 1 tablet by mouth at bedtime.   Yes Historical Provider, MD  ciprofloxacin (CIPRO) 250 MG tablet Take 1 tablet (250 mg total) by mouth 2 (two) times daily. Patient not taking: Reported on 10/24/2014 02/06/14   Bjorn Loser, MD  ibuprofen (ADVIL,MOTRIN) 600 MG tablet Take 1 tablet (600 mg total) by mouth every 6 (six) hours as needed (mild pain). Patient not taking: Reported on 10/24/2014 02/06/14   Thurnell Lose, MD   BP 166/91 mmHg  Pulse 84  Temp(Src) 98.4 F (36.9 C) (Oral)  Resp 18  Wt 165 lb (74.844 kg)  SpO2 100% Physical Exam  Constitutional: She is oriented to person, place, and time. She appears well-developed and well-nourished.  HENT:  Head: Normocephalic and atraumatic.  Right Ear: External ear normal.  Left Ear: External ear normal.  Eyes: Conjunctivae and EOM are normal. Pupils are equal, round, and reactive to light.  Neck: Normal range of motion and phonation normal. Neck supple.  Cardiovascular: Normal rate, regular rhythm and normal heart sounds.   Pulmonary/Chest: Effort normal and breath sounds normal. She exhibits no bony tenderness.  Abdominal: Soft. There is no tenderness.  Musculoskeletal: Normal range of motion.  Tender right shoulder without deformity. Decreased range of motion right shoulder secondary to pain. Left index finger with deformity at the PIP joint. It is mildly angulated. No other left hand or finger injury.  Neurological: She is alert and oriented to person, place, and time. No cranial nerve deficit or sensory deficit. She exhibits normal muscle tone. Coordination normal.  Skin: Skin is warm, dry and intact.  Psychiatric: She has a normal mood and affect. Her behavior is normal. Judgment and thought content normal.  Nursing note and vitals reviewed.   ED Course  Procedures (including critical care time)  Procedures:  NERVE BLOCK Performed by: Richarda Blade Consent: Verbal consent obtained. Required  items: required blood products, implants, devices, and special equipment available Time out: Immediately prior to procedure a "time out" was called to verify the correct patient, procedure, equipment, support staff and site/side marked as required.  Indication: finger dislocation Nerve block body site: Left finger 2  Preparation: Patient was prepped and draped in the usual sterile fashion. Needle gauge: 25 G Location technique: anatomical landmarks  Local anesthetic: xylocaine  Anesthetic total: 5 ml  Outcome: pain improved Patient tolerance: Patient tolerated the procedure well with no immediate complications.  Reduction of dislocation Date/Time: 10:06 PM Performed by: Richarda Blade Authorized by: Richarda Blade Consent: Verbal consent obtained. Risks and benefits: risks, benefits and alternatives were discussed Consent given by: patient Required items: required blood products, implants, devices, and special equipment available Time out: Immediately prior to procedure a "time out" was called to verify the correct patient, procedure, equipment, support staff and site/side marked as required.  Vitals: Vital signs were monitored during sedation. Patient tolerance: Patient tolerated the procedure well with no immediate complications. Joint:  left PIP # 2 Reduction technique: hyperextension, elongation, flexion  Post- reduction film- normal, no fracture  Radiologic imaging report reviewed and images by radiography  - viewed, by me.      Labs Review Labs Reviewed - No data to display  Imaging Review Dg Shoulder Right  10/24/2014   CLINICAL DATA:  Tripped and fall with resulting right shoulder pain.  EXAM: RIGHT SHOULDER - 2+ VIEW  COMPARISON:  None.  FINDINGS: There is no evidence of fracture or dislocation. There is no evidence of arthropathy or other focal bone abnormality. Soft tissues are unremarkable.  IMPRESSION: Negative.   Electronically Signed   By: Van Clines M.D.   On: 10/24/2014 20:14   Dg Finger Index Left  10/24/2014   CLINICAL DATA:  Reduction of finger PIP joint  EXAM: LEFT INDEX FINGER 2+V  COMPARISON:  None.  FINDINGS: Interval reduction of PIP joint. The osseous structures appear intact. No fracture or subluxation. No radio-opaque foreign bodies noted.  IMPRESSION: 1. Status post reduction of PIP joint dislocation   Electronically Signed   By: Kerby Moors M.D.   On: 10/24/2014 21:58   Dg Finger Index Left  10/24/2014   CLINICAL DATA:  Finger injury from fall today.  Initial encounter.  EXAM: LEFT INDEX FINGER 2+V  COMPARISON:  None.  FINDINGS: There is dorsal and ulnar dislocation at the proximal interphalangeal joint of the index finger. No associated fracture identified. There is surrounding soft tissue swelling.  IMPRESSION: Dislocation of the proximal interphalangeal joint of the left index finger. Postreduction views suggested to exclude subtle avulsion fracture.   Electronically Signed   By: Richardean Sale M.D.   On: 10/24/2014 20:13     EKG Interpretation None      MDM   Final diagnoses:  Finger dislocation, initial encounter  Contusion shoulder/arm, right, initial encounter    Contusion right shoulder and left finger dislocation secondary to mechanical fall. Doubt serious injury.    Nursing Notes Reviewed/ Care Coordinated Applicable Imaging Reviewed Interpretation of Laboratory Data incorporated into ED treatment  The patient appears reasonably screened and/or stabilized for discharge and I doubt any other medical condition or other [facility] requiring further screening, evaluation, or treatment in the ED at this time prior to discharge.  Plan: Home Medications- usual; Home Treatments- Sling, Splint; return here if the recommended treatment, does not improve the symptoms; Recommended follow up- Ortho, 1 week   Daleen Bo, MD 10/24/14 2213

## 2014-10-24 NOTE — Discharge Instructions (Signed)
Use ice on the sore spots 3 or 4 times a day for the next several days. Use the sling, and splint, as needed for comfort.    Contusion A contusion is a deep bruise. Contusions are the result of an injury that caused bleeding under the skin. The contusion may turn blue, purple, or yellow. Minor injuries will give you a painless contusion, but more severe contusions may stay painful and swollen for a few weeks.  CAUSES  A contusion is usually caused by a blow, trauma, or direct force to an area of the body. SYMPTOMS   Swelling and redness of the injured area.  Bruising of the injured area.  Tenderness and soreness of the injured area.  Pain. DIAGNOSIS  The diagnosis can be made by taking a history and physical exam. An X-ray, CT scan, or MRI may be needed to determine if there were any associated injuries, such as fractures. TREATMENT  Specific treatment will depend on what area of the body was injured. In general, the best treatment for a contusion is resting, icing, elevating, and applying cold compresses to the injured area. Over-the-counter medicines may also be recommended for pain control. Ask your caregiver what the best treatment is for your contusion. HOME CARE INSTRUCTIONS   Put ice on the injured area.  Put ice in a plastic bag.  Place a towel between your skin and the bag.  Leave the ice on for 15-20 minutes, 3-4 times a day, or as directed by your health care provider.  Only take over-the-counter or prescription medicines for pain, discomfort, or fever as directed by your caregiver. Your caregiver may recommend avoiding anti-inflammatory medicines (aspirin, ibuprofen, and naproxen) for 48 hours because these medicines may increase bruising.  Rest the injured area.  If possible, elevate the injured area to reduce swelling. SEEK IMMEDIATE MEDICAL CARE IF:   You have increased bruising or swelling.  You have pain that is getting worse.  Your swelling or pain is not  relieved with medicines. MAKE SURE YOU:   Understand these instructions.  Will watch your condition.  Will get help right away if you are not doing well or get worse. Document Released: 02/17/2005 Document Revised: 05/15/2013 Document Reviewed: 03/15/2011 [facility] Patient Information 2015 [place], [place]. This information is not intended to replace advice given to you by your health care provider. Make sure you discuss any questions you have with your health care provider.  Finger Dislocation Finger dislocation is the displacement of bones in your finger at the joints. Most commonly, finger dislocation occurs at the proximal interphalangeal joint (the joint closest to your knuckle). Very strong, fibrous tissues (ligaments) and joint capsules connect the three bones of your fingers.  CAUSES Dislocation is caused by a forceful impact. This impact moves these bones off the joint and often tears your ligaments.  SYMPTOMS Symptoms of finger dislocation include:  Deformity of your finger.  Pain, with loss of movement. DIAGNOSIS  Finger dislocation is diagnosed with a physical exam. Often, X-ray exams are done to see if you have associated injuries, such as bone fractures. TREATMENT  Finger dislocations are treated by putting your bones back into position (reduction) either by manually moving the bones back into place or through surgery. Your finger is then kept in a fixed position (immobilized) with the use of a dressing or splint for a brief period. When your ligament has to be surgically repaired, it needs to be kept in a fixed position with a dressing or splint  for 1 to 2 weeks. Because joint stiffness is a long-term complication of finger dislocation, hand exercises or physical therapy to increase the range of motion and to regain strength is usually started as soon as the ligament is healed. Exercises and therapy generally last no more than 3 months. HOME CARE INSTRUCTIONS The following  measures can help to reduce pain and speed up the healing process:  Rest your injured joint. Do not move until instructed otherwise by your caregiver. Avoid activities similar to the one that caused your injury.  Apply ice to your injured joint for the first day or 2 after your reduction or as directed by your caregiver. Applying ice helps to reduce inflammation and pain.  Put ice in a plastic bag.  Place a towel between your skin and the bag.  Leave the ice on for 15-20 minutes at a time, every 2 hours while you are awake.  Elevate your hand above your heart as directed by your caregiver to reduce swelling.  Take over-the-counter or prescription medicine for pain as your caregiver instructs you. SEEK IMMEDIATE MEDICAL CARE IF:  Your dressing or splint becomes damaged.  Your pain becomes worse rather than better.  You lose feeling in your finger, or it becomes cold and white. MAKE SURE YOU:  Understand these instructions.  Will watch your condition.  Will get help right away if you are not doing well or get worse. Document Released: 05/07/2000 Document Revised: 08/02/2011 Document Reviewed: 02/28/2011 [facility] Patient Information 2015 [place], [place]. This information is not intended to replace advice given to you by your health care provider. Make sure you discuss any questions you have with your health care provider.

## 2014-10-24 NOTE — ED Notes (Signed)
Ortho called 

## 2014-10-24 NOTE — ED Notes (Signed)
Pt states she was at church [place] and her shoe got stuck and caused her to fall  Pt states she fell into a wall   Pt states she injured her left index finger, obvious deformity, right shoulder pain, and back pain  Pt has hx of back surgery
